# Patient Record
Sex: Male | Born: 1985 | Hispanic: Yes | Marital: Single | Smoking: Current every day smoker
Health system: Southern US, Community
[De-identification: ages and names within clinical notes are randomized; demographics above are authoritative.]

---

## 2006-03-06 ENCOUNTER — Emergency Department: Payer: Self-pay | Admitting: Emergency Medicine

## 2006-05-29 ENCOUNTER — Emergency Department: Payer: Self-pay | Admitting: Emergency Medicine

## 2007-03-21 ENCOUNTER — Emergency Department: Payer: Self-pay | Admitting: Emergency Medicine

## 2008-08-28 ENCOUNTER — Emergency Department: Payer: Self-pay | Admitting: Emergency Medicine

## 2014-06-11 ENCOUNTER — Emergency Department: Payer: Self-pay | Admitting: Student

## 2015-03-14 ENCOUNTER — Encounter: Payer: Self-pay | Admitting: Medical Oncology

## 2015-03-14 ENCOUNTER — Emergency Department
Admission: EM | Admit: 2015-03-14 | Discharge: 2015-03-14 | Disposition: A | Discharge status: Home / Self Care | Payer: Self-pay | Attending: Emergency Medicine | Admitting: Emergency Medicine

## 2015-03-14 DIAGNOSIS — F17200 Nicotine dependence, unspecified, uncomplicated: Secondary | ICD-10-CM | POA: Insufficient documentation

## 2015-03-14 DIAGNOSIS — K029 Dental caries, unspecified: Secondary | ICD-10-CM | POA: Insufficient documentation

## 2015-03-14 DIAGNOSIS — K0381 Cracked tooth: Secondary | ICD-10-CM | POA: Insufficient documentation

## 2015-03-14 DIAGNOSIS — K032 Erosion of teeth: Secondary | ICD-10-CM | POA: Insufficient documentation

## 2015-03-14 MED ORDER — DIFLUNISAL 500 MG PO TABS: 500.0000 mg | ORAL_TABLET | Freq: Two times a day (BID) | ORAL | Status: DC

## 2015-03-14 MED ORDER — MAGIC MOUTHWASH W/LIDOCAINE: 5.0000 mL | Freq: Four times a day (QID) | ORAL | Status: DC

## 2015-03-14 NOTE — Discharge Instructions (Signed)
Dental Care and Dentist Visits Dental care supports good overall health. Regular dental visits can also help you avoid dental pain, bleeding, infection, and other more serious health problems in the future. It is important to keep the mouth healthy because diseases in the teeth, gums, and other oral tissues can spread to other areas of the body. Some problems, such as diabetes, heart disease, and pre-term labor have been associated with poor oral health.  See your dentist every 6 months. If you experience emergency problems such as a toothache or broken tooth, go to the dentist right away. If you see your dentist regularly, you may catch problems early. It is easier to be treated for problems in the early stages.  WHAT TO EXPECT AT A DENTIST VISIT  Your dentist will look for many common oral health problems and recommend proper treatment. At your regular dental visit, you can expect:  Gentle cleaning of the teeth and gums. This includes scraping and polishing. This helps to remove the sticky substance around the teeth and gums (plaque). Plaque forms in the mouth shortly after eating. Over time, plaque hardens on the teeth as tartar. If tartar is not removed regularly, it can cause problems. Cleaning also helps remove stains.  Periodic X-rays. These pictures of the teeth and supporting bone will help your dentist assess the health of your teeth.  Periodic fluoride treatments. Fluoride is a natural mineral shown to help strengthen teeth. Fluoride treatmentinvolves applying a fluoride gel or varnish to the teeth. It is most commonly done in children.  Examination of the mouth, tongue, jaws, teeth, and gums to look for any oral health problems, such as:  Cavities (dental caries). This is decay on the tooth caused by plaque, sugar, and acid in the mouth. It is best to catch a cavity when it is small.  Inflammation of the gums caused by plaque buildup (gingivitis).  Problems with the mouth or malformed  or misaligned teeth.  Oral cancer or other diseases of the soft tissues or jaws. KEEP YOUR TEETH AND GUMS HEALTHY For healthy teeth and gums, follow these general guidelines as well as your dentist's specific advice:  Have your teeth professionally cleaned at the dentist every 6 months.  Brush twice daily with a fluoride toothpaste.  Floss your teeth daily.  Ask your dentist if you need fluoride supplements, treatments, or fluoride toothpaste.  Eat a healthy diet. Reduce foods and drinks with added sugar.  Avoid smoking. TREATMENT FOR ORAL HEALTH PROBLEMS If you have oral health problems, treatment varies depending on the conditions present in your teeth and gums.  Your caregiver will most likely recommend good oral hygiene at each visit.  For cavities, gingivitis, or other oral health disease, your caregiver will perform a procedure to treat the problem. This is typically done at a separate appointment. Sometimes your caregiver will refer you to another dental specialist for specific tooth problems or for surgery. SEEK IMMEDIATE DENTAL CARE IF:  You have pain, bleeding, or soreness in the gum, tooth, jaw, or mouth area.  A permanent tooth becomes loose or separated from the gum socket.  You experience a blow or injury to the mouth or jaw area.   This information is not intended to replace advice given to you by your health care provider. Make sure you discuss any questions you have with your health care provider.   Document Released: 12/15/2010 Document Revised: 06/27/2011 Document Reviewed: 12/15/2010 Elsevier Interactive Patient Education 2016 [employer]   Tooth Injuries  Tooth injuries (tooth trauma) include cracked or broken teeth (fractures), teeth that have been moved out of place or dislodged (luxations), and knocked-out teeth (avulsions).  A tooth injury often needs to be treated quickly to save the tooth. However, sometimes it is not possible to save a tooth  after an injury, so the tooth may need to be removed (extracted).  CAUSES  Tooth injuries may be caused by any force that is strong enough to chip, break, dislodge, or knock out a tooth. Forces may be due to:  Sports injuries.  Falls.  Accidents.  Fights. RISK FACTORS  You may be more likely to injure a tooth if you play a contact sport without using a mouthguard.  SYMPTOMS  A tooth that is forced into the gum may appear dislodged or moved out of position into the tooth socket. A fractured tooth may not be as obvious. Symptoms of a tooth injury include:  Pain, especially with chewing.  A loose tooth.  Bleeding in or around the tooth.  Swelling or bruising near the tooth.  Swelling or bruising of the lip over the injured tooth.  Increased sensitivity to heat and cold. DIAGNOSIS  A tooth injury can be diagnosed with a medical history and a physical exam. You may also need dental X-rays to check for injuries to the root of the tooth.  TREATMENT  Treatment depends on the type of injury you have and how bad it is. Treatment may need to be done quickly to save your tooth. Possible treatments include:  Replacing a tooth fragment with a filling, cap, or hard, protective cover (crown). This may be an option for a chip or fracture that does not involve the inside of your tooth (pulp).  Having a procedure to repair the inside of the tooth (root canal) and then having a crown placed on top. This may be done to treat a tooth fracture that involves the pulp.  Repositioning a dislodged tooth, then doing a root canal. The root canal usually needs to be done within a few days of the injury.  Replacing a knocked-out tooth in the socket, if possible, then doing a root canal a few weeks later.  Tooth extraction for a fracture that extends below your gumline or splits your tooth completely. HOME CARE INSTRUCTIONS  Take medicines only as directed by your dental provider or health care provider.  Keep all  follow-up visits as directed by your dental provider or health care provider. This is important.  Do not eat or chew on very hard objects. These include ice cubes, pens, pencils, hard candy, and popcorn kernels.  Do not clench or grind your teeth. Tell your dental provider or health care provider if you grind your teeth while you sleep.  Apply ice to your mouth near the injured tooth as directed by your dental provider or health care provider.  Follow instructions about rinsing your mouth with salt water as directed by your dental provider or health care provider.  Do not use your teeth to open packages.  Always wear mouth protection when you play contact sports. SEEK MEDICAL CARE IF:  You continue to have tooth pain after a tooth injury.  Your tooth is sensitive to heat and cold.  You develop swelling near your injured tooth.  You have a fever.  You are unable to open your jaw.  You are drooling and it is getting worse. This information is not intended to replace advice given to you by your health care  provider. Make sure you discuss any questions you have with your health care provider.  Document Released: 12/31/2003 Document Revised: 08/19/2014 Document Reviewed: 03/31/2014  Elsevier Interactive Patient Education [employer]

## 2015-03-14 NOTE — ED Notes (Signed)
Left sided tooth pain that began this am.

## 2015-03-14 NOTE — ED Notes (Signed)
States he developed pain to left upper gumline this am..states he thinks it may be a wisdom tooth

## 2015-03-14 NOTE — ED Provider Notes (Signed)
[facility] Emergency Department Provider Note  ____________________________________________  Time seen: Approximately 3:26 PM  I have reviewed the triage vital signs and the nursing notes.   HISTORY  Chief Complaint Dental Pain    HPI Bryan Bush is a 29 y.o. male who presents emergency department complaining of left upper mandible dental pain. He states that pain began this morning. He has a history of poor dentition and does not have a dentist. He states that there is no recent injury to dentition. He denies fevers or chills, difficulty swallowing or breathing, chest pain, vomiting. He states that he has been using cold mouthwashes to alleviate symptoms.   History reviewed. No pertinent past medical history.  There are no active problems to display for this patient.   History reviewed. No pertinent past surgical history.  Current Outpatient Rx  Name  Route  Sig  Dispense  Refill  . diflunisal (DOLOBID) 500 MG TABS tablet   Oral   Take 1 tablet (500 mg total) by mouth 2 (two) times daily.   60 tablet   0   . magic mouthwash w/lidocaine SOLN   Oral   Take 5 mLs by mouth 4 (four) times daily.   240 mL   0     Dispense in a 1/1/1/1 ratio     Allergies Review of patient's allergies indicates no known allergies.  No family history on file.  Social History Social History  Substance Use Topics  . Smoking status: Current Some Day Smoker  . Smokeless tobacco: None  . Alcohol Use: No    Review of Systems Constitutional: No fever/chills Eyes: No visual changes. ENT: No sore throat. Endorses dental pain to the left upper jaw. Cardiovascular: Denies chest pain. Respiratory: Denies shortness of breath. Gastrointestinal: No abdominal pain.  No nausea, no vomiting.  No diarrhea.  No constipation. Genitourinary: Negative for dysuria. Musculoskeletal: Negative for back pain. Skin: Negative for rash. Neurological: Negative for headaches,  focal weakness or numbness.  10-point ROS otherwise negative.  ____________________________________________   PHYSICAL EXAM:  VITAL SIGNS: ED Triage Vitals  Enc Vitals Group     BP 03/14/15 1440 121/84 mmHg     Pulse Rate 03/14/15 1440 83     Resp 03/14/15 1440 18     Temp 03/14/15 1440 98.4 F (36.9 C)     Temp Source 03/14/15 1440 Oral     SpO2 03/14/15 1440 99 %     Weight 03/14/15 1440 170 lb (77.111 kg)     Height 03/14/15 1440 5\' 4"  (1.626 m)     Head Cir --      Peak Flow --      Pain Score 03/14/15 1440 10     Pain Loc --      Pain Edu? --      Excl. in GC? --     Constitutional: Alert and oriented. Well appearing and in no acute distress. Eyes: Conjunctivae are normal. PERRL. EOMI. Head: Atraumatic. Nose: No congestion/rhinnorhea. Mouth/Throat: Mucous membranes are moist.  Oropharynx non-erythematous. Poor dentition throughout. Multiple caries, rushes, some fractures noted. Patient's wisdom tooth on the left upper severely eroded with pulp/nerve in the exposed. No surrounding erythema or edema. Loose fragments. Neck: No stridor.   Cardiovascular: Normal rate, regular rhythm. Grossly normal heart sounds.  Good peripheral circulation. Respiratory: Normal respiratory effort.  No retractions. Lungs CTAB. Gastrointestinal: Soft and nontender. No distention. No abdominal bruits. No CVA tenderness. Musculoskeletal: No lower extremity tenderness nor edema.  No  joint effusions. Neurologic:  Normal speech and language. No gross focal neurologic deficits are appreciated. No gait instability. Skin:  Skin is warm, dry and intact. No rash noted. Psychiatric: Mood and affect are normal. Speech and behavior are normal.  ____________________________________________   LABS (all labs ordered are listed, but only abnormal results are displayed)  Labs Reviewed - No data to  display ____________________________________________  EKG   ____________________________________________  RADIOLOGY   ____________________________________________   PROCEDURES  Procedure(s) performed: None  Critical Care performed: No  ____________________________________________   INITIAL IMPRESSION / ASSESSMENT AND PLAN / ED COURSE  Pertinent labs & imaging results that were available during my care of the patient were reviewed by me and considered in my medical decision making (see chart for details).  She is history, symptoms, physical exam are taken into consideration for diagnosis. Patient is to be given anti-inflammatories and magic mouthwash for symptom control. I advised the patient that he is to follow-up with [facility] dental clinic for extensive dental care. Patient verbalizes understanding of diagnosis and treatment plan and verbalizes compliance with same. Tender block was offered in the emergency department and patient declined at this time. ____________________________________________   FINAL CLINICAL IMPRESSION(S) / ED DIAGNOSES  Final diagnoses:  Tooth erosion      Racheal Patches, PA-C 03/14/15 1601  Arnaldo Natal, MD 03/15/15 2055

## 2015-04-08 ENCOUNTER — Encounter: Payer: Self-pay | Admitting: *Deleted

## 2015-04-08 ENCOUNTER — Emergency Department
Admission: EM | Admit: 2015-04-08 | Discharge: 2015-04-08 | Disposition: A | Discharge status: Home / Self Care | Payer: Self-pay | Attending: Emergency Medicine | Admitting: Emergency Medicine

## 2015-04-08 DIAGNOSIS — K0381 Cracked tooth: Secondary | ICD-10-CM | POA: Insufficient documentation

## 2015-04-08 DIAGNOSIS — F17200 Nicotine dependence, unspecified, uncomplicated: Secondary | ICD-10-CM | POA: Insufficient documentation

## 2015-04-08 DIAGNOSIS — Z791 Long term (current) use of non-steroidal anti-inflammatories (NSAID): Secondary | ICD-10-CM | POA: Insufficient documentation

## 2015-04-08 DIAGNOSIS — K029 Dental caries, unspecified: Secondary | ICD-10-CM | POA: Insufficient documentation

## 2015-04-08 DIAGNOSIS — Z79899 Other long term (current) drug therapy: Secondary | ICD-10-CM | POA: Insufficient documentation

## 2015-04-08 MED ORDER — AMOXICILLIN 500 MG PO TABS: 500.0000 mg | ORAL_TABLET | Freq: Two times a day (BID) | ORAL | Status: DC

## 2015-04-08 MED ORDER — LIDOCAINE VISCOUS 2 % MT SOLN: 20.0000 mL | OROMUCOSAL | Status: DC | PRN

## 2015-04-08 MED ORDER — HYDROCODONE-ACETAMINOPHEN 5-325 MG PO TABS: 1.0000 | ORAL_TABLET | ORAL | Status: DC | PRN

## 2015-04-08 MED ORDER — IBUPROFEN 800 MG PO TABS: 800.0000 mg | ORAL_TABLET | Freq: Three times a day (TID) | ORAL | Status: DC | PRN

## 2015-04-08 NOTE — ED Provider Notes (Signed)
[facility] Emergency Department Provider Note  ____________________________________________  Time seen: Approximately 8:22 PM  I have reviewed the triage vital signs and the nursing notes.   HISTORY  Chief Complaint Dental Pain   HPI Bryan Bush is a 29 y.o. male resents with left lower jaw pain. Patient states he got a history of dental problems but is never seen a dentist.   History reviewed. No pertinent past medical history.  There are no active problems to display for this patient.   History reviewed. No pertinent past surgical history.  Current Outpatient Rx  Name  Route  Sig  Dispense  Refill  . amoxicillin (AMOXIL) 500 MG tablet   Oral   Take 1 tablet (500 mg total) by mouth 2 (two) times daily.   20 tablet   0   . diflunisal (DOLOBID) 500 MG TABS tablet   Oral   Take 1 tablet (500 mg total) by mouth 2 (two) times daily.   60 tablet   0   . HYDROcodone-acetaminophen (NORCO) 5-325 MG tablet   Oral   Take 1-2 tablets by mouth every 4 (four) hours as needed for moderate pain.   15 tablet   0   . ibuprofen (ADVIL,MOTRIN) 800 MG tablet   Oral   Take 1 tablet (800 mg total) by mouth every 8 (eight) hours as needed.   30 tablet   0   . lidocaine (XYLOCAINE) 2 % solution   Mouth/Throat   Use as directed 20 mLs in the mouth or throat as needed for mouth pain.   100 mL   0   . magic mouthwash w/lidocaine SOLN   Oral   Take 5 mLs by mouth 4 (four) times daily.   240 mL   0     Dispense in a 1/1/1/1 ratio     Allergies Review of patient's allergies indicates no known allergies.  No family history on file.  Social History Social History  Substance Use Topics  . Smoking status: Current Some Day Smoker  . Smokeless tobacco: None  . Alcohol Use: No    Review of Systems Constitutional: No fever/chills Eyes: No visual changes. ENT: No sore throat. Positive for dental caries. Positive for dental pain Cardiovascular:  Denies chest pain. Respiratory: Denies shortness of breath. Gastrointestinal: No abdominal pain.  No nausea, no vomiting.  No diarrhea.  No constipation. Genitourinary: Negative for dysuria. Musculoskeletal: Negative for back pain. Skin: Negative for rash. Neurological: Negative for headaches, focal weakness or numbness.  10-point ROS otherwise negative.  ____________________________________________   PHYSICAL EXAM:  VITAL SIGNS: ED Triage Vitals  Enc Vitals Group     BP 04/08/15 1926 119/90 mmHg     Pulse Rate 04/08/15 1926 81     Resp 04/08/15 1926 18     Temp 04/08/15 1926 98.6 F (37 C)     Temp Source 04/08/15 1926 Oral     SpO2 04/08/15 1926 98 %     Weight 04/08/15 1926 170 lb (77.111 kg)     Height 04/08/15 1926 5\' 4"  (1.626 m)     Head Cir --      Peak Flow --      Pain Score 04/08/15 1927 5     Pain Loc --      Pain Edu? --      Excl. in GC? --     Constitutional: Alert and oriented. Well appearing and in no acute distress. Eyes: Conjunctivae are normal. PERRL. EOMI. Head:  Atraumatic. Nose: No congestion/rhinnorhea. Mouth/Throat: Mucous membranes are moist.  Oropharynx non-erythematous. Multiple dental caries noted throughout the mouth. Almost every tooth is either fractured or has cavities. Neck: No stridor.   Cardiovascular: Normal rate, regular rhythm. Grossly normal heart sounds.  Good peripheral circulation. Respiratory: Normal respiratory effort.  No retractions. Lungs CTAB.Marland Kitchen Musculoskeletal: No lower extremity tenderness nor edema.  No joint effusions. Neurologic:  Normal speech and language. No gross focal neurologic deficits are appreciated. No gait instability. Skin:  Skin is warm, dry and intact. No rash noted. Psychiatric: Mood and affect are normal. Speech and behavior are normal.  ____________________________________________   LABS (all labs ordered are listed, but only abnormal results are displayed)  Labs Reviewed - No data to  display ____________________________________________   PROCEDURES  Procedure(s) performed: None  Critical Care performed: No  ____________________________________________   INITIAL IMPRESSION / ASSESSMENT AND PLAN / ED COURSE  Pertinent labs & imaging results that were available during my care of the patient were reviewed by me and considered in my medical decision making (see chart for details).  Multiple dental caries with early gum inflammation and abscess. Rx given for amoxicillin 500 mg 3 times a day. Motrin 800 mg 3 times a day and Biaxin 5/325 and viscous lidocaine. Patient follow-up with the attached list of dental. ____________________________________________   FINAL CLINICAL IMPRESSION(S) / ED DIAGNOSES  Final diagnoses:  Dental caries      Evangeline Dakin, PA-C 04/08/15 2030  Myrna Blazer, MD 04/09/15 [id number]

## 2015-04-08 NOTE — Discharge Instructions (Signed)
OPTIONS FOR DENTAL FOLLOW UP CARE ° °[place] Department of Health and Human Services - Local Safety Net Dental Clinics °http://www.ncdhhs.gov/dph/oralhealth/services/safetynetclinics.htm °  °[facility] (336-562-3123) ° °[facility] ([phone]) ° °[facility] ([phone] ext [phone]) ° °[facility] ([phone]) ° °[facility] ([phone]) °This clinic caters to the indigent population and is on a lottery system. °Location: °[facility], [address], [place] °Clinic Hours: °Wednesdays from 6pm - 9pm, patients seen by a lottery system. °For dates, call or go to [email or web address]/[email or web address] °Services: °Cleanings, fillings and simple extractions. °Payment Options: °DENTAL WORK IS FREE OF CHARGE. Bring proof of income or support. °Best way to get seen: °Arrive at 5:15 pm - this is a lottery, NOT first come/first serve, so arriving earlier will not increase your chances of being seen. °  °  °[facility] °[phone] °Select option 1 for emergencies °  °Location: °[facility], [address], [place] °Clinic Hours: °No walk-ins accepted - call the day before to schedule an appointment. °Check in times are 9:30 am and 1:30 pm. °Services: °Simple extractions, temporary fillings, pulpectomy/pulp debridement, uncomplicated abscess drainage. °Payment Options: °PAYMENT IS DUE AT THE TIME OF SERVICE.  Fee is usually $100-200, additional surgical procedures (e.g. abscess drainage) may be extra. °Cash, checks, Visa/MasterCard accepted.  Can file Medicaid if patient is covered for dental - patient should call case worker to check. °No discount for [facility] patients. °Best way to get seen: °MUST call the day before and get onto the schedule. Can usually be seen the next 1-2 days. No walk-ins accepted. °  °  °[facility] °[phone] °   °Location: °[facility], [address], [place] °Clinic Hours: °M, W, Th, F 8am or 1:30pm, Tues 9a or 1:30 - first come/first served. °Services: °Simple extractions, temporary fillings, uncomplicated abscess drainage.  You do not need to be an [place] resident. °Payment Options: °PAYMENT IS DUE AT THE TIME OF SERVICE. °Dental insurance, otherwise sliding scale - bring proof of income or support. °Depending on income and treatment needed, cost is usually $50-200. °Best way to get seen: °Arrive early as it is first come/first served. °  °  °[facility] °[phone] °  °Location: °[address] °Clinic Hours: °Mon-Thu 8a-5p °Services: °Most basic dental services including extractions and fillings. °Payment Options: °PAYMENT IS DUE AT THE TIME OF SERVICE. °Sliding scale, up to 50% off - bring proof if income or support. °Medicaid with dental option accepted. °Best way to get seen: °Call to schedule an appointment, can usually be seen within 2 weeks OR they will try to see walk-ins - show up at 8a or 2p (you may have to wait). °  °  °[facility] °[phone] °[place] RESIDENTS ONLY °  °Location: °[facility], [address], [place],  [address] °Clinic Hours: By appointment only. °Monday - Thursday 8am-5pm, Friday 8am-12pm °Services: Cleanings, fillings, extractions. °Payment Options: °PAYMENT IS DUE AT THE TIME OF SERVICE. °Cash, Visa or MasterCard. Sliding scale - $30 minimum per service. °Best way to get seen: °Come in to office, complete packet and make an appointment - need proof of income °or support monies for each household member and proof of [place] residence. °Usually takes about a month to get in. °  °  °[facility] °[phone] °  °Location: °[address]   [place] [facility] Hours: Walk-in Urgent Care Dental Services are offered Monday-Friday  mornings only. The numbers of emergencies accepted daily is limited to the number of providers available. Maximum 15 - Mondays, Wednesdays & Thursdays Maximum 10 - Tuesdays & Fridays Services: You do not need to be a [place] resident to be seen for a dental emergency. Emergencies are defined as pain, swelling, abnormal bleeding, or dental trauma. Walkins will receive x-rays if needed. NOTE: Dental cleaning is not an emergency. Payment Options: PAYMENT IS DUE AT THE TIME OF SERVICE. Minimum co-pay is $40.00 for uninsured patients. Minimum co-pay is $3.00 for Medicaid with dental coverage. Dental Insurance is accepted and must be presented at time of visit. Medicare does not cover dental. Forms of payment: Cash, credit card, checks. Best way to get seen: If not previously registered with the clinic, walk-in dental registration begins at 7:15 am and is on a first come/first serve basis. If previously registered with the clinic, call to make an appointment.     [facility] [id number] [place] RESIDENTS ONLY   Location: [address], [place], [place] Clinic Hours: Mon-Thu 10a-2p Services: Extractions only! Payment Options: FREE (donations accepted) - bring proof of income or support Best way to get seen: Call and schedule an appointment OR come at 8am on the 1st Monday of every month (except for holidays) when it is first come/first served.     [facility] [phone]   Location: [address], [place] [facility] Hours: Friday mornings Services, Payment Options, Best way to get seen: Call for info   Dental Caries Dental caries (also called tooth decay) is the most common oral disease. It can occur at any age but is more common in children and young adults.  HOW DENTAL CARIES DEVELOPS  The process of decay begins when bacteria and foods (particularly sugars and starches) combine in your mouth to produce plaque. Plaque is a substance that sticks to  the hard, outer surface of a tooth (enamel). The bacteria in plaque produce acids that attack enamel. These acids may also attack the root surface of a tooth (cementum) if it is exposed. Repeated attacks dissolve these surfaces and create holes in the tooth (cavities). If left untreated, the acids destroy the other layers of the tooth.  RISK FACTORS  Frequent sipping of sugary beverages.   Frequent snacking on sugary and starchy foods, especially those that easily get stuck in the teeth.   Poor oral hygiene.   Dry mouth.   Substance abuse such as methamphetamine abuse.   Broken or poor-fitting dental restorations.   Eating disorders.   Gastroesophageal reflux disease (GERD).   Certain radiation treatments to the head and neck. SYMPTOMS In the early stages of dental caries, symptoms are seldom present. Sometimes white, chalky areas may be seen on the enamel or other tooth layers. In later stages, symptoms may include:  Pits and holes on the enamel.  Toothache after sweet, hot, or cold foods or drinks are consumed.  Pain around the tooth.  Swelling around the tooth. DIAGNOSIS  Most of the time, dental caries is detected during a regular dental checkup. A diagnosis is made after a thorough medical and dental history is taken and the surfaces of your teeth are checked for signs of dental caries. Sometimes special instruments, such as lasers, are used to check for dental caries. Dental X-ray exams may be taken so that areas not visible to the eye (such as between the contact areas  of the teeth) can be checked for cavities.  °TREATMENT  °If dental caries is in its early stages, it may be reversed with a fluoride treatment or an application of a remineralizing agent at the dental office. Thorough brushing and flossing at home is needed to aid these treatments. If it is in its later stages, treatment depends on the location and extent of tooth destruction:  °· If a small area of the  tooth has been destroyed, the destroyed area will be removed and cavities will be filled with a material such as gold, silver amalgam, or composite resin.   °· If a large area of the tooth has been destroyed, the destroyed area will be removed and a cap (crown) will be fitted over the remaining tooth structure.   °· If the center part of the tooth (pulp) is affected, a procedure called a root canal will be needed before a filling or crown can be placed.   °· If most of the tooth has been destroyed, the tooth may need to be pulled (extracted). °HOME CARE INSTRUCTIONS °You can prevent, stop, or reverse dental caries at home by practicing good oral hygiene. Good oral hygiene includes: °· Thoroughly cleaning your teeth at least twice a day with a toothbrush and dental floss.   °· Using a fluoride toothpaste. A fluoride mouth rinse may also be used if recommended by your dentist or health care provider.   °· Restricting the amount of sugary and starchy foods and sugary liquids you consume.   °· Avoiding frequent snacking on these foods and sipping of these liquids.   °· Keeping regular visits with a dentist for checkups and cleanings. °PREVENTION  °· Practice good oral hygiene. °· Consider a dental sealant. A dental sealant is a coating material that is applied by your dentist to the pits and grooves of teeth. The sealant prevents food from being trapped in them. It may protect the teeth for several years. °· Ask about fluoride supplements if you live in a community without fluorinated water or with water that has a low fluoride content. Use fluoride supplements as directed by your dentist or health care provider. °· Allow fluoride varnish applications to teeth if directed by your dentist or health care provider. °  °This information is not intended to replace advice given to you by your health care provider. Make sure you discuss any questions you have with your health care provider. °  °Document Released: 12/25/2001  Document Revised: 04/25/2014 Document Reviewed: 04/06/2012 °Elsevier Interactive Patient Education ©2016 [employer] ° °

## 2015-04-08 NOTE — ED Notes (Signed)
Patient c/o dental pain in left lower jaw.

## 2015-04-29 ENCOUNTER — Emergency Department
Admission: EM | Admit: 2015-04-29 | Discharge: 2015-04-29 | Disposition: A | Discharge status: Home / Self Care | Payer: Self-pay | Attending: Student | Admitting: Student

## 2015-04-29 ENCOUNTER — Encounter: Payer: Self-pay | Admitting: Emergency Medicine

## 2015-04-29 DIAGNOSIS — F17200 Nicotine dependence, unspecified, uncomplicated: Secondary | ICD-10-CM | POA: Insufficient documentation

## 2015-04-29 DIAGNOSIS — Z79899 Other long term (current) drug therapy: Secondary | ICD-10-CM | POA: Insufficient documentation

## 2015-04-29 DIAGNOSIS — K0889 Other specified disorders of teeth and supporting structures: Secondary | ICD-10-CM | POA: Insufficient documentation

## 2015-04-29 MED ORDER — AMOXICILLIN 500 MG PO TABS: 500.0000 mg | ORAL_TABLET | Freq: Two times a day (BID) | ORAL | Status: DC

## 2015-04-29 MED ORDER — HYDROCODONE-ACETAMINOPHEN 5-325 MG PO TABS: 1.0000 | ORAL_TABLET | ORAL | Status: DC | PRN

## 2015-04-29 MED ORDER — LIDOCAINE VISCOUS 2 % MT SOLN: 20.0000 mL | OROMUCOSAL | Status: DC | PRN

## 2015-04-29 MED ORDER — IBUPROFEN 800 MG PO TABS: 800.0000 mg | ORAL_TABLET | Freq: Three times a day (TID) | ORAL | Status: DC | PRN

## 2015-04-29 NOTE — ED Notes (Signed)
Pt reports being here for right sided upper dental pain.

## 2015-04-29 NOTE — Discharge Instructions (Signed)
OPTIONS FOR DENTAL FOLLOW UP CARE ° °[place] Department of Health and Human Services - Local Safety Net Dental Clinics °http://www.ncdhhs.gov/dph/oralhealth/services/safetynetclinics.htm °  °[facility] ([phone]) ° °[facility] ([phone]) ° °[facility] ([phone] ext [phone]) ° °[facility] ([phone]) ° °[facility] ([phone]) °This clinic caters to the indigent population and is on a lottery system. °Location: °[facility], [address], [place] °Clinic Hours: °Wednesdays from 6pm - 9pm, patients seen by a lottery system. °For dates, call or go to [email or web address] °Services: °Cleanings, fillings and simple extractions. °Payment Options: °DENTAL WORK IS FREE OF CHARGE. Bring proof of income or support. °Best way to get seen: °Arrive at 5:15 pm - this is a lottery, NOT first come/first serve, so arriving earlier will not increase your chances of being seen. °  °  °[facility] °[phone] °Select option 1 for emergencies °  °Location: °[facility], [address], [place] °Clinic Hours: °No walk-ins accepted - call the day before to schedule an appointment. °Check in times are 9:30 am and 1:30 pm. °Services: °Simple extractions, temporary fillings, pulpectomy/pulp debridement, uncomplicated abscess drainage. °Payment Options: °PAYMENT IS DUE AT THE TIME OF SERVICE.  Fee is usually $100-200, additional surgical procedures (e.g. abscess drainage) may be extra. °Cash, checks, Visa/MasterCard accepted.  Can file Medicaid if patient is covered for dental - patient should call case worker to check. °No discount for [facility] patients. °Best way to get seen: °MUST call the day before and get onto the schedule. Can usually be seen the next 1-2 days. No walk-ins accepted. °  °  °[facility] °[phone] °   °Location: °[facility], [address], [place] °Clinic Hours: °M, W, Th, F 8am or 1:30pm, Tues 9a or 1:30 - first come/first served. °Services: °Simple extractions, temporary fillings, uncomplicated abscess drainage.  You do not need to be an [place] resident. °Payment Options: °PAYMENT IS DUE AT THE TIME OF SERVICE. °Dental insurance, otherwise sliding scale - bring proof of income or support. °Depending on income and treatment needed, cost is usually $50-200. °Best way to get seen: °Arrive early as it is first come/first served. °  °  °[facility] °[phone] °  °Location: °[address] °Clinic Hours: °Mon-Thu 8a-5p °Services: °Most basic dental services including extractions and fillings. °Payment Options: °PAYMENT IS DUE AT THE TIME OF SERVICE. °Sliding scale, up to 50% off - bring proof if income or support. °Medicaid with dental option accepted. °Best way to get seen: °Call to schedule an appointment, can usually be seen within 2 weeks OR they will try to see walk-ins - show up at 8a or 2p (you may have to wait). °  °  °[facility] °[phone] °[place] RESIDENTS ONLY °  °Location: °[facility], [address], [place], [place] [address] °Clinic Hours: By appointment only. °Monday - Thursday 8am-5pm, Friday 8am-12pm °Services: Cleanings, fillings, extractions. °Payment Options: °PAYMENT IS DUE AT THE TIME OF SERVICE. °Cash, Visa or MasterCard. Sliding scale - $30 minimum per service. °Best way to get seen: °Come in to office, complete packet and make an appointment - need proof of income °or support monies for each household member and proof of [place] residence. °Usually takes about a month to get in. °  °  °[facility] °[phone] °  °Location: °[address]   [place] °Clinic Hours: Walk-in Urgent Care Dental Services are offered Monday-Friday  mornings only. °The numbers of emergencies accepted daily is limited to the number of °providers available. °Maximum 15 - Mondays, Wednesdays & Thursdays °Maximum 10 - Tuesdays & Fridays °Services: °You do not need to be a [place] resident to be seen for a dental emergency. °Emergencies are defined as pain, swelling, abnormal bleeding, or dental trauma. Walkins will receive x-rays if needed. °NOTE: Dental cleaning is not an emergency. °Payment Options: °PAYMENT IS DUE AT THE TIME OF SERVICE. °Minimum co-pay is $40.00 for uninsured patients. °Minimum co-pay is $3.00 for Medicaid with dental coverage. °Dental Insurance is accepted and must be presented at time of visit. °Medicare does not cover dental. °Forms of payment: Cash, credit card, checks. °Best way to get seen: °If not previously registered with the clinic, walk-in dental registration begins at 7:15 am and is on a first come/first serve basis. °If previously registered with the clinic, call to make an appointment. °  °  °[facility] °[phone] °[place] RESIDENTS ONLY °  °Location: °[address], [place], [place] °Clinic Hours: °Mon-Thu 10a-2p °Services: Extractions only! °Payment Options: °FREE (donations accepted) - bring proof of income or support °Best way to get seen: °Call and schedule an appointment OR come at 8am on the 1st Monday of every month (except for holidays) when it is first come/first served. °  °  °[facility] °[phone] °  °Location: °[address], [place] °Clinic Hours: °Friday mornings °Services, Payment Options, Best way to get seen: °Call for info °

## 2015-04-29 NOTE — ED Provider Notes (Signed)
[facility] Emergency Department Provider Note  ____________________________________________  Time seen: Approximately 6:35 PM  I have reviewed the triage vital signs and the nursing notes.   HISTORY  Chief Complaint Dental Pain    HPI Jaceon Heiberger is a 30 y.o. male presents with complaints of right sided upper dental pain. Patient seen here a month ago for left lower side dental pain. Patient reports not being able to see the dentist at this time.   No past medical history on file.  There are no active problems to display for this patient.   No past surgical history on file.  Current Outpatient Rx  Name  Route  Sig  Dispense  Refill  . amoxicillin (AMOXIL) 500 MG tablet   Oral   Take 1 tablet (500 mg total) by mouth 2 (two) times daily.   20 tablet   0   . diflunisal (DOLOBID) 500 MG TABS tablet   Oral   Take 1 tablet (500 mg total) by mouth 2 (two) times daily.   60 tablet   0   . HYDROcodone-acetaminophen (NORCO) 5-325 MG tablet   Oral   Take 1-2 tablets by mouth every 4 (four) hours as needed for moderate pain.   15 tablet   0   . ibuprofen (ADVIL,MOTRIN) 800 MG tablet   Oral   Take 1 tablet (800 mg total) by mouth every 8 (eight) hours as needed.   30 tablet   0   . lidocaine (XYLOCAINE) 2 % solution   Mouth/Throat   Use as directed 20 mLs in the mouth or throat as needed for mouth pain.   100 mL   0     Allergies Review of patient's allergies indicates no known allergies.  History reviewed. No pertinent family history.  Social History Social History  Substance Use Topics  . Smoking status: Current Some Day Smoker  . Smokeless tobacco: None  . Alcohol Use: No    Review of Systems Constitutional: No fever/chills Eyes: No visual changes. ENT: No sore throat. Cardiovascular: Denies chest pain. Respiratory: Denies shortness of breath. Gastrointestinal: No abdominal pain.  No nausea, no vomiting.  No diarrhea.  No  constipation. Genitourinary: Negative for dysuria. Musculoskeletal: Negative for back pain. Skin: Negative for rash. Neurological: Negative for headaches, focal weakness or numbness.  10-point ROS otherwise negative.  ____________________________________________   PHYSICAL EXAM:  VITAL SIGNS: ED Triage Vitals  Enc Vitals Group     BP 04/29/15 1823 129/74 mmHg     Pulse Rate 04/29/15 1823 81     Resp --      Temp 04/29/15 1823 98 F (36.7 C)     Temp Source 04/29/15 1823 Oral     SpO2 04/29/15 1823 98 %     Weight 04/29/15 1823 170 lb (77.111 kg)     Height 04/29/15 1823 5\' 4"  (1.626 m)     Head Cir --      Peak Flow --      Pain Score 04/29/15 1823 5     Pain Loc --      Pain Edu? --      Excl. in GC? --     Constitutional: Alert and oriented. Well appearing and in no acute distress. Eyes: Conjunctivae are normal. PERRL. EOMI. Head: Atraumatic. Nose: No congestion/rhinnorhea. Mouth/Throat: Mucous membranes are moist.  Oropharynx non-erythematous. Neck: No stridor.   Cardiovascular: Normal rate, regular rhythm. Grossly normal heart sounds.  Good peripheral circulation. Respiratory: Normal respiratory effort.  No retractions. Lungs CTAB. Gastrointestinal: Soft and nontender. No distention. No abdominal bruits. No CVA tenderness. Musculoskeletal: No lower extremity tenderness nor edema.  No joint effusions. Neurologic:  Normal speech and language. No gross focal neurologic deficits are appreciated. No gait instability. Skin:  Skin is warm, dry and intact. No rash noted. Psychiatric: Mood and affect are normal. Speech and behavior are normal.  ____________________________________________   LABS (all labs ordered are listed, but only abnormal results are displayed)  Labs Reviewed - No data to display   PROCEDURES  Procedure(s) performed: None  Critical Care performed: No  ____________________________________________   INITIAL IMPRESSION / ASSESSMENT AND PLAN  / ED COURSE  Pertinent labs & imaging results that were available during my care of the patient were reviewed by me and considered in my medical decision making (see chart for details).  Dental caries. Rx given for Amoxil 500 mg 3 times a day, viscous lidocaine, Motrin 800 mg 3 times a day, and Vicodin 5/325. Patient follow-up PCP or see the dentist with attached list provided. Patient voices no other emergency medical complaints at this time. ____________________________________________   FINAL CLINICAL IMPRESSION(S) / ED DIAGNOSES  Final diagnoses:  Pain, dental      Evangeline Dakinharles M Laurynn Mccorvey, PA-C 04/29/15 1848  Gayla DossEryka A Gayle, MD 04/29/15 2308

## 2015-11-06 ENCOUNTER — Emergency Department
Admission: EM | Admit: 2015-11-06 | Discharge: 2015-11-06 | Disposition: A | Discharge status: Home / Self Care | Payer: Self-pay | Attending: Emergency Medicine | Admitting: Emergency Medicine

## 2015-11-06 ENCOUNTER — Encounter: Payer: Self-pay | Admitting: *Deleted

## 2015-11-06 DIAGNOSIS — K032 Erosion of teeth: Secondary | ICD-10-CM

## 2015-11-06 DIAGNOSIS — K047 Periapical abscess without sinus: Secondary | ICD-10-CM | POA: Insufficient documentation

## 2015-11-06 DIAGNOSIS — F17200 Nicotine dependence, unspecified, uncomplicated: Secondary | ICD-10-CM | POA: Insufficient documentation

## 2015-11-06 DIAGNOSIS — K029 Dental caries, unspecified: Secondary | ICD-10-CM

## 2015-11-06 MED ORDER — AMOXICILLIN 875 MG PO TABS: 875.0000 mg | ORAL_TABLET | Freq: Two times a day (BID) | ORAL | Status: DC

## 2015-11-06 MED ORDER — LIDOCAINE-EPINEPHRINE 2 %-1:100000 IJ SOLN
1.7000 mL | Freq: Once | INTRAMUSCULAR | Status: AC
  Administered 2015-11-06: 1.7 mL
  Filled 2015-11-06: qty 1.7

## 2015-11-06 MED ORDER — IBUPROFEN 800 MG PO TABS: 800.0000 mg | ORAL_TABLET | Freq: Three times a day (TID) | ORAL | Status: DC | PRN

## 2015-11-06 MED ORDER — MAGIC MOUTHWASH W/LIDOCAINE: 5.0000 mL | Freq: Four times a day (QID) | ORAL | Status: DC

## 2015-11-06 NOTE — ED Notes (Signed)
  Reviewed d/c instructions, follow-up care, and prescriptions with pt. Pt verbalized understanding 

## 2015-11-06 NOTE — ED Provider Notes (Signed)
[facility] Emergency Department Provider Note  ____________________________________________  Time seen: Approximately 8:01 PM  I have reviewed the triage vital signs and the nursing notes.   HISTORY  Chief Complaint Dental Pain    HPI Grayce Sessionslexandro Donnald GarreLara is a 30 y.o. male who presents to emergency department complaining of upper and in lower left dental pain. Patient states that he has known bad dentition but does not have a dentist. Patient denies any fevers or chills, difficulty swallowing or breathing. Patient reports that left upper dental pain has been intermittent for weeks 2 days. Patient reports sharp left lower dental pain. Patient has not tried any medications for this complaint prior to arrival. It is described as sharp, severe with chewing.   History reviewed. No pertinent past medical history.  There are no active problems to display for this patient.   History reviewed. No pertinent past surgical history.  Current Outpatient Rx  Name  Route  Sig  Dispense  Refill  . amoxicillin (AMOXIL) 875 MG tablet   Oral   Take 1 tablet (875 mg total) by mouth 2 (two) times daily.   14 tablet   0   . diflunisal (DOLOBID) 500 MG TABS tablet   Oral   Take 1 tablet (500 mg total) by mouth 2 (two) times daily.   60 tablet   0   . HYDROcodone-acetaminophen (NORCO) 5-325 MG tablet   Oral   Take 1-2 tablets by mouth every 4 (four) hours as needed for moderate pain.   15 tablet   0   . ibuprofen (ADVIL,MOTRIN) 800 MG tablet   Oral   Take 1 tablet (800 mg total) by mouth every 8 (eight) hours as needed.   30 tablet   0   . lidocaine (XYLOCAINE) 2 % solution   Mouth/Throat   Use as directed 20 mLs in the mouth or throat as needed for mouth pain.   100 mL   0   . magic mouthwash w/lidocaine SOLN   Oral   Take 5 mLs by mouth 4 (four) times daily.   240 mL   0     Dispense in a 1/1/1/1 ratio. Use lidocaine, diphen ...     Allergies Review of  patient's allergies indicates no known allergies.  History reviewed. No pertinent family history.  Social History Social History  Substance Use Topics  . Smoking status: Current Some Day Smoker  . Smokeless tobacco: Never Used  . Alcohol Use: No     Review of Systems  Constitutional: No fever/chills Eyes: No visual changes. No discharge ZOX:WRUEAVWUENT:Positive for left upper and lower dental pain. Cardiovascular: no chest pain. Respiratory: no cough. No SOB. Gastrointestinal: No abdominal pain.  No nausea, no vomiting.  Musculoskeletal: Negative for musculoskeletal pain. Skin: Negative for rash, abrasions, lacerations, ecchymosis. Neurological: Negative for headaches, focal weakness or numbness. 10-point ROS otherwise negative.  ____________________________________________   PHYSICAL EXAM:  VITAL SIGNS: ED Triage Vitals  Enc Vitals Group     BP 11/06/15 1936 136/84 mmHg     Pulse Rate 11/06/15 1936 92     Resp 11/06/15 1936 18     Temp 11/06/15 1936 98.2 F (36.8 C)     Temp Source 11/06/15 1936 Oral     SpO2 11/06/15 1936 98 %     Weight 11/06/15 1936 170 lb (77.111 kg)     Height 11/06/15 1936 5\' 5"  (1.651 m)     Head Cir --      Peak  Flow --      Pain Score 11/06/15 1937 5     Pain Loc --      Pain Edu? --      Excl. in GC? --      Constitutional: Alert and oriented. Well appearing and in no acute distress. Eyes: Conjunctivae are normal. PERRL. EOMI. Head: Atraumatic. ENT:      Ears:       Nose: No congestion/rhinnorhea.      Mouth/Throat: Mucous membranes are moist. Poor dentition throughout mouth. Multiple significant erosions into the pulp. Patient has surrounding erythema and edema to teeth #18 and 19. Significant erosion into dentition is observed. Oropharynx is nonerythematous and nonedematous. Neck: No stridor.   Hematological/Lymphatic/Immunilogical: No cervical lymphadenopathy. Cardiovascular: Normal rate, regular rhythm. Normal S1 and S2.  Good peripheral  circulation. Respiratory: Normal respiratory effort without tachypnea or retractions. Lungs CTAB. Good air entry to the bases with no decreased or absent breath sounds. Musculoskeletal: Full range of motion to all extremities. No gross deformities appreciated. Neurologic:  Normal speech and language. No gross focal neurologic deficits are appreciated.  Skin:  Skin is warm, dry and intact. No rash noted. Psychiatric: Mood and affect are normal. Speech and behavior are normal. Patient exhibits appropriate insight and judgement.   ____________________________________________   LABS (all labs ordered are listed, but only abnormal results are displayed)  Labs Reviewed - No data to display ____________________________________________  EKG   ____________________________________________  RADIOLOGY   No results found.  ____________________________________________    PROCEDURES  Procedure(s) performed:    Dental block is performed using 1.7 mL of lidocaine with epi. This is injected into the mandibular branch of the trigeminal nerve. Injection is performed using a 27-gauge needle on the dental carpijet. Patient tolerated procedure well with no complications.   Medications  lidocaine-EPINEPHrine (XYLOCAINE W/EPI) 2 %-1:100000 (with pres) injection 1.7 mL (not administered)     ____________________________________________   INITIAL IMPRESSION / ASSESSMENT AND PLAN / ED COURSE  Pertinent labs & imaging results that were available during my care of the patient were reviewed by me and considered in my medical decision making (see chart for details).  Patient's diagnosis is consistent with dental erosions, dental caries, and infected dentition. Patient is given a dental block in the emergency department for symptom control which she tolerated well.. Patient will be discharged home with prescriptions for antibiotics, and 100 mg ibuprofen, Magic mouthwash. Patient is to follow up  with [facility] dental clinic for significant dental work. Patient is given ED precautions to return to the ED for any worsening or new symptoms.     ____________________________________________  FINAL CLINICAL IMPRESSION(S) / ED DIAGNOSES  Final diagnoses:  Dental infection  Dental erosion extending into pulp  Dental caries      NEW MEDICATIONS STARTED DURING THIS VISIT:  New Prescriptions   AMOXICILLIN (AMOXIL) 875 MG TABLET    Take 1 tablet (875 mg total) by mouth 2 (two) times daily.   IBUPROFEN (ADVIL,MOTRIN) 800 MG TABLET    Take 1 tablet (800 mg total) by mouth every 8 (eight) hours as needed.   MAGIC MOUTHWASH W/LIDOCAINE SOLN    Take 5 mLs by mouth 4 (four) times daily.        This chart was dictated using voice recognition software/Dragon. Despite best efforts to proofread, errors can occur which can change the meaning. Any change was purely unintentional.    Racheal Patches, PA-C 11/06/15 2019  Emily Filbert, MD 11/06/15 2039

## 2015-11-06 NOTE — Discharge Instructions (Signed)
Dental Abscess °A dental abscess is a collection of pus in or around a tooth. °CAUSES °This condition is caused by a bacterial infection around the root of the tooth that involves the inner part of the tooth (pulp). It may result from: °· Severe tooth decay. °· Trauma to the tooth that allows bacteria to enter into the pulp, such as a broken or chipped tooth. °· Severe gum disease around a tooth. °SYMPTOMS °Symptoms of this condition include: °· Severe pain in and around the infected tooth. °· Swelling and redness around the infected tooth, in the mouth, or in the face. °· Tenderness. °· Pus drainage. °· Bad breath. °· Bitter taste in the mouth. °· Difficulty swallowing. °· Difficulty opening the mouth. °· Nausea. °· Vomiting. °· Chills. °· Swollen neck glands. °· Fever. °DIAGNOSIS °This condition is diagnosed with examination of the infected tooth. During the exam, your dentist may tap on the infected tooth. Your dentist will also ask about your medical and dental history and may order X-rays. °TREATMENT °This condition is treated by eliminating the infection. This may be done with: °· Antibiotic medicine. °· A root canal. This may be performed to save the tooth. °· Pulling (extracting) the tooth. This may also involve draining the abscess. This is done if the tooth cannot be saved. °HOME CARE INSTRUCTIONS °· Take medicines only as directed by your dentist. °· If you were prescribed antibiotic medicine, finish all of it even if you start to feel better. °· Rinse your mouth (gargle) often with salt water to relieve pain or swelling. °· Do not drive or operate heavy machinery while taking pain medicine. °· Do not apply heat to the outside of your mouth. °· Keep all follow-up visits as directed by your dentist. This is important. °SEEK MEDICAL CARE IF: °· Your pain is worse and is not helped by medicine. °SEEK IMMEDIATE MEDICAL CARE IF: °· You have a fever or chills. °· Your symptoms suddenly get worse. °· You have a  very bad headache. °· You have problems breathing or swallowing. °· You have trouble opening your mouth. °· You have swelling in your neck or around your eye. °  °This information is not intended to replace advice given to you by your health care provider. Make sure you discuss any questions you have with your health care provider. °  °Document Released: 04/04/2005 Document Revised: 08/19/2014 Document Reviewed: 04/01/2014 °Elsevier Interactive Patient Education ©2016 [employer]vier [employer] ° °Dental Care and Dentist Visits °Dental care supports good overall health. Regular dental visits can also help you avoid dental pain, bleeding, infection, and other more serious health problems in the future. It is important to keep the mouth healthy because diseases in the teeth, gums, and other oral tissues can spread to other areas of the body. Some problems, such as diabetes, heart disease, and pre-term labor have been associated with poor oral health.  °See your dentist every 6 months. If you experience emergency problems such as a toothache or broken tooth, go to the dentist right away. If you see your dentist regularly, you may catch problems early. It is easier to be treated for problems in the early stages.  °WHAT TO EXPECT AT A DENTIST VISIT  °Your dentist will look for many common oral health problems and recommend proper treatment. At your regular dental visit, you can expect: °· Gentle cleaning of the teeth and gums. This includes scraping and polishing. This helps to remove the sticky substance around the teeth and gums (  plaque). Plaque forms in the mouth shortly after eating. Over time, plaque hardens on the teeth as tartar. If tartar is not removed regularly, it can cause problems. Cleaning also helps remove stains.  Periodic X-rays. These pictures of the teeth and supporting bone will help your dentist assess the health of your teeth.  Periodic fluoride treatments. Fluoride is a natural mineral shown to help  strengthen teeth. Fluoride treatmentinvolves applying a fluoride gel or varnish to the teeth. It is most commonly done in children.  Examination of the mouth, tongue, jaws, teeth, and gums to look for any oral health problems, such as:  Cavities (dental caries). This is decay on the tooth caused by plaque, sugar, and acid in the mouth. It is best to catch a cavity when it is small.  Inflammation of the gums caused by plaque buildup (gingivitis).  Problems with the mouth or malformed or misaligned teeth.  Oral cancer or other diseases of the soft tissues or jaws. KEEP YOUR TEETH AND GUMS HEALTHY For healthy teeth and gums, follow these general guidelines as well as your dentist's specific advice:  Have your teeth professionally cleaned at the dentist every 6 months.  Brush twice daily with a fluoride toothpaste.  Floss your teeth daily.  Ask your dentist if you need fluoride supplements, treatments, or fluoride toothpaste.  Eat a healthy diet. Reduce foods and drinks with added sugar.  Avoid smoking. TREATMENT FOR ORAL HEALTH PROBLEMS If you have oral health problems, treatment varies depending on the conditions present in your teeth and gums.  Your caregiver will most likely recommend good oral hygiene at each visit.  For cavities, gingivitis, or other oral health disease, your caregiver will perform a procedure to treat the problem. This is typically done at a separate appointment. Sometimes your caregiver will refer you to another dental specialist for specific tooth problems or for surgery. SEEK IMMEDIATE DENTAL CARE IF:  You have pain, bleeding, or soreness in the gum, tooth, jaw, or mouth area.  A permanent tooth becomes loose or separated from the gum socket.  You experience a blow or injury to the mouth or jaw area.   This information is not intended to replace advice given to you by your health care provider. Make sure you discuss any questions you have with your  health care provider.   Document Released: 12/15/2010 Document Revised: 06/27/2011 Document Reviewed: 12/15/2010 Elsevier Interactive Patient Education 2016 [employer] Dental

## 2015-11-06 NOTE — ED Notes (Signed)
Pt c/o upper and lower L sided dental pain x 2 days. Pt denies relief from OTC ibuprofen. Pt has no insurance and no dentist.

## 2015-11-15 ENCOUNTER — Encounter: Payer: Self-pay | Admitting: Emergency Medicine

## 2015-11-15 ENCOUNTER — Emergency Department
Admission: EM | Admit: 2015-11-15 | Discharge: 2015-11-15 | Disposition: A | Discharge status: Home / Self Care | Payer: Self-pay | Attending: Emergency Medicine | Admitting: Emergency Medicine

## 2015-11-15 DIAGNOSIS — K047 Periapical abscess without sinus: Secondary | ICD-10-CM

## 2015-11-15 DIAGNOSIS — K089 Disorder of teeth and supporting structures, unspecified: Secondary | ICD-10-CM

## 2015-11-15 DIAGNOSIS — K029 Dental caries, unspecified: Secondary | ICD-10-CM

## 2015-11-15 DIAGNOSIS — Z79899 Other long term (current) drug therapy: Secondary | ICD-10-CM | POA: Insufficient documentation

## 2015-11-15 DIAGNOSIS — F17200 Nicotine dependence, unspecified, uncomplicated: Secondary | ICD-10-CM | POA: Insufficient documentation

## 2015-11-15 MED ORDER — CLINDAMYCIN HCL 300 MG PO CAPS: 300.0000 mg | ORAL_CAPSULE | Freq: Three times a day (TID) | ORAL | 0 refills | Status: DC

## 2015-11-15 MED ORDER — DEXAMETHASONE 2 MG PO TABS: ORAL_TABLET | ORAL | 0 refills | Status: DC

## 2015-11-15 NOTE — ED Triage Notes (Signed)
Patient from home via POV. C/O left lower tooth pain. Seen here prior for same and given amox. Patient states he completed his ABX course and it has not improved.

## 2015-11-15 NOTE — ED Provider Notes (Signed)
[facility] Emergency Department Provider Note  ____________________________________________  Time seen: Approximately 12:56 PM  I have reviewed the triage vital signs and the nursing notes.   HISTORY  Chief Complaint Dental Pain    HPI Bryan Bush is a 30 y.o. male , NAD, presents to the emergency department with left lower dental pain. Patient was seen in this emergency department approximately 9 days ago for the same complaint. States he took the medications as prescribed but believes the infection is still present in his gums. Patient notes he has not been able to schedule an appointment with a dentist due to his work schedule. Has not had any fevers, chills, body aches. Denies any swelling about his face or mouth. Has been able to eat and drink without significant difficulty other than pain about the left lower gumline. Has not had any numbness, weakness, tingling. Denies any headache or jaw pain.   History reviewed. No pertinent past medical history.  There are no active problems to display for this patient.   History reviewed. No pertinent surgical history.  Prior to Admission medications   Medication Sig Start Date End Date Taking? Authorizing Provider  clindamycin (CLEOCIN) 300 MG capsule Take 1 capsule (300 mg total) by mouth 3 (three) times daily. 11/15/15   Dequavius Kuhner L Sivan Quast, PA-C  dexamethasone (DECADRON) 2 MG tablet Take 6 tablets on Day 1 with food, then decrease by 1 tablet daily until finished (6,5,4,3,2,1) 11/15/15   Kash Mothershead L Mozella Rexrode, PA-C  diflunisal (DOLOBID) 500 MG TABS tablet Take 1 tablet (500 mg total) by mouth 2 (two) times daily. 03/14/15   Delorise Royals Cuthriell, PA-C  ibuprofen (ADVIL,MOTRIN) 800 MG tablet Take 1 tablet (800 mg total) by mouth every 8 (eight) hours as needed. 11/06/15   Delorise Royals Cuthriell, PA-C  lidocaine (XYLOCAINE) 2 % solution Use as directed 20 mLs in the mouth or throat as needed for mouth pain. 04/29/15   Evangeline Dakin, PA-C  magic mouthwash w/lidocaine SOLN Take 5 mLs by mouth 4 (four) times daily. 11/06/15   Delorise Royals Cuthriell, PA-C    Allergies Review of patient's allergies indicates no known allergies.  History reviewed. No pertinent family history.  Social History Social History  Substance Use Topics  . Smoking status: Current Some Day Smoker  . Smokeless tobacco: Never Used  . Alcohol use No     Review of Systems  Constitutional: No fever/chills, Fatigue ENT: Positive dental pain and swelling left lower teeth. No sore throat, difficulty swallowing. Cardiovascular: No chest pain. Respiratory:  No shortness of breath.  Musculoskeletal: Negative for TMJ or facial pain. Skin: Negative for rash, redness, skin sores, oozing, weeping. Neurological: Negative for headaches, focal weakness or numbness. No tingling 10-point ROS otherwise negative.  ____________________________________________   PHYSICAL EXAM:  VITAL SIGNS: ED Triage Vitals  Enc Vitals Group     BP 11/15/15 1236 123/78     Pulse Rate 11/15/15 1236 76     Resp 11/15/15 1236 16     Temp 11/15/15 1236 97.9 F (36.6 C)     Temp Source 11/15/15 1236 Oral     SpO2 11/15/15 1236 98 %     Weight --      Height --      Head Circumference --      Peak Flow --      Pain Score 11/15/15 1232 8     Pain Loc --      Pain Edu? --  Excl. in GC? --      Constitutional: Alert and oriented. Well appearing and in no acute distress. Eyes: Conjunctivae are normal.  Head: Atraumatic. ENT:      Nose: No congestion/rhinnorhea.      Mouth/Throat: Poor dentition throughout with multiple teeth that are discolored and with large cavernous cavities. Left lower posterior gumline with diffuse irritation but no swelling, oozing or weeping. Mucous membranes are moist. Pharynx without erythema, swelling, exudate. Neck: Supple with full range of motion. Hematological/Lymphatic/Immunilogical: No cervical  lymphadenopathy. Cardiovascular: Good peripheral circulation. Respiratory: Normal respiratory effort without tachypnea or retractions.  Musculoskeletal: No tenderness to palpation of the TMJ, zygomatic process or mandible. Full range of motion of the TMJ without pain, popping, locking. Neurologic:  Normal speech and language. No gross focal neurologic deficits are appreciated.  Skin:  Skin is warm, dry and intact. No rash or redness, swelling noted. Psychiatric: Mood and affect are normal. Speech and behavior are normal. Patient exhibits appropriate insight and judgement.   ____________________________________________   LABS  None ____________________________________________  EKG  None ____________________________________________  RADIOLOGY  None ____________________________________________    PROCEDURES  Procedure(s) performed: None   Procedures   Medications - No data to display   ____________________________________________   INITIAL IMPRESSION / ASSESSMENT AND PLAN / ED COURSE  Pertinent labs & imaging results that were available during my care of the patient were reviewed by me and considered in my medical decision making (see chart for details).  Clinical Course    Patient's diagnosis is consistent with Dental infection, pain due to dental caries and poor dentition. Patient will be discharged home with prescriptions for clindamycin and dexamethasone to take as directed. Patient advised not to take ibuprofen while on the Decadron but may take Tylenol as needed for pain. Patient is to follow up with the dental clinic at [facility] services in [facility] tomorrow for further evaluation and treatment. On discussion had with the patient in regards to need for dental care as continued use of antibiotics without appropriate to help care could lead to resistance. Patient is given ED precautions to return to the ED for any worsening or new symptoms.       ____________________________________________  FINAL CLINICAL IMPRESSION(S) / ED DIAGNOSES  Final diagnoses:  Dental infection  Pain due to dental caries  Poor dentition      NEW MEDICATIONS STARTED DURING THIS VISIT:  Discharge Medication List as of 11/15/2015  1:07 PM    START taking these medications   Details  clindamycin (CLEOCIN) 300 MG capsule Take 1 capsule (300 mg total) by mouth 3 (three) times daily., Starting Sun 11/15/2015, Print    dexamethasone (DECADRON) 2 MG tablet Take 6 tablets on Day 1 with food, then decrease by 1 tablet daily until finished (6,5,4,3,2,1), Print             Hope Pigeon, PA-C 11/15/15 1706    Minna Antis, MD 11/16/15 1511

## 2015-11-15 NOTE — ED Notes (Signed)
States he was seen about 1 1/2 weeks ago for dental pain/abscess . States he finished his antibiotic and feels like the infection is back

## 2015-12-08 ENCOUNTER — Encounter: Payer: Self-pay | Admitting: Emergency Medicine

## 2015-12-08 ENCOUNTER — Emergency Department
Admission: EM | Admit: 2015-12-08 | Discharge: 2015-12-08 | Disposition: A | Discharge status: Home / Self Care | Payer: Self-pay | Attending: Student | Admitting: Student

## 2015-12-08 DIAGNOSIS — Z79899 Other long term (current) drug therapy: Secondary | ICD-10-CM | POA: Insufficient documentation

## 2015-12-08 DIAGNOSIS — K047 Periapical abscess without sinus: Secondary | ICD-10-CM | POA: Insufficient documentation

## 2015-12-08 DIAGNOSIS — F17200 Nicotine dependence, unspecified, uncomplicated: Secondary | ICD-10-CM | POA: Insufficient documentation

## 2015-12-08 DIAGNOSIS — K029 Dental caries, unspecified: Secondary | ICD-10-CM | POA: Insufficient documentation

## 2015-12-08 MED ORDER — TRAMADOL HCL 50 MG PO TABS: 50.0000 mg | ORAL_TABLET | Freq: Three times a day (TID) | ORAL | 0 refills | Status: AC | PRN

## 2015-12-08 MED ORDER — PENICILLIN V POTASSIUM 500 MG PO TABS: 500.0000 mg | ORAL_TABLET | Freq: Four times a day (QID) | ORAL | 0 refills | Status: DC

## 2015-12-08 MED ORDER — PENICILLIN V POTASSIUM 500 MG PO TABS
500.0000 mg | ORAL_TABLET | Freq: Once | ORAL | Status: AC
  Administered 2015-12-08: 500 mg via ORAL
  Filled 2015-12-08: qty 1

## 2015-12-08 MED ORDER — TRAMADOL HCL 50 MG PO TABS
100.0000 mg | ORAL_TABLET | Freq: Once | ORAL | Status: AC
  Administered 2015-12-08: 100 mg via ORAL
  Filled 2015-12-08: qty 2

## 2015-12-08 MED ORDER — AMOXICILLIN-POT CLAVULANATE 875-125 MG PO TABS: 1.0000 | ORAL_TABLET | Freq: Two times a day (BID) | ORAL | 0 refills | Status: DC

## 2015-12-08 NOTE — ED Triage Notes (Signed)
Pt arrived to the ED for complaints of having a dental abscess on the bottom left side. Pt reports that this is recurrent and that he just finished a round of antibiotics.

## 2015-12-08 NOTE — ED Notes (Signed)
Discharge instructions reviewed with patient. Patient verbalized understanding. Patient ambulated to lobby without difficulty.   

## 2015-12-12 NOTE — ED Provider Notes (Signed)
[facility] Emergency Department Provider Note ____________________________________________  Time seen: 2023  I have reviewed the triage vital signs and the nursing notes.  HISTORY  Chief Complaint  Oral Swelling  HPI Bryan Bush is a 30 y.o. male visits to the ED for violation of recurrent dental abscess and infection to the lower jaw. Patient with poor dentition tension, dental decay, and gingivitis, reports for reevaluation of recurrent swelling to the right lower jaw. He describes being on 3 separate enemas over the last 3 months. He was most recently treated at [facility] and placed on penicillin. The patient describes recurrence of swelling even before the antibiotic was completely finished. Denies any interim fevers, chills, sweats. He also denies any spontaneous drainage at this time. He denies any medication allergies at this time. He reports being treated with clindamycin and amoxicillin, respectively.  History reviewed. No pertinent past medical history.  There are no active problems to display for this patient.  History reviewed. No pertinent surgical history.  Prior to Admission medications   Medication Sig Start Date End Date Taking? Authorizing Provider  amoxicillin-clavulanate (AUGMENTIN) 875-125 MG tablet Take 1 tablet by mouth 2 (two) times daily. 12/08/15   Cybele Maule V Bacon Furkan Keenum, PA-C  clindamycin (CLEOCIN) 300 MG capsule Take 1 capsule (300 mg total) by mouth 3 (three) times daily. 11/15/15   Jami L Hagler, PA-C  dexamethasone (DECADRON) 2 MG tablet Take 6 tablets on Day 1 with food, then decrease by 1 tablet daily until finished (6,5,4,3,2,1) 11/15/15   Jami L Hagler, PA-C  diflunisal (DOLOBID) 500 MG TABS tablet Take 1 tablet (500 mg total) by mouth 2 (two) times daily. 03/14/15   Delorise Royals Cuthriell, PA-C  ibuprofen (ADVIL,MOTRIN) 800 MG tablet Take 1 tablet (800 mg total) by mouth every 8 (eight) hours as needed. 11/06/15   Delorise Royals Cuthriell, PA-C   lidocaine (XYLOCAINE) 2 % solution Use as directed 20 mLs in the mouth or throat as needed for mouth pain. 04/29/15   Evangeline Dakin, PA-C  magic mouthwash w/lidocaine SOLN Take 5 mLs by mouth 4 (four) times daily. 11/06/15   Delorise Royals Cuthriell, PA-C  traMADol (ULTRAM) 50 MG tablet Take 1 tablet (50 mg total) by mouth 3 (three) times daily as needed. 12/08/15 12/15/15  Charlesetta Ivory Anasia Agro, PA-C   Allergies Review of patient's allergies indicates no known allergies.  History reviewed. No pertinent family history.  Social History Social History  Substance Use Topics  . Smoking status: Current Some Day Smoker  . Smokeless tobacco: Never Used  . Alcohol use No    Review of Systems  Constitutional: Negative for fever. Eyes: Negative for visual changes. ENT: Negative for sore throat. Dental pain and jaw swelling as above. CSkin: Negative for rash. Neurological: Negative for headaches, focal weakness or numbness. ____________________________________________  PHYSICAL EXAM:  VITAL SIGNS: ED Triage Vitals  Enc Vitals Group     BP 12/08/15 1917 131/89     Pulse Rate 12/08/15 1917 90     Resp 12/08/15 1917 18     Temp 12/08/15 1917 98.5 F (36.9 C)     Temp Source 12/08/15 1917 Oral     SpO2 12/08/15 1914 99 %     Weight 12/08/15 1918 165 lb (74.8 kg)     Height 12/08/15 1918 5\' 6"  (1.676 m)     Head Circumference --      Peak Flow --      Pain Score 12/08/15 1918 5  Pain Loc --      Pain Edu? --      Excl. in GC? --     Constitutional: Alert and oriented. Well appearing and in no distress. Head: Normocephalic and atraumatic.      Eyes: Conjunctivae are normal. PERRL. Normal extraocular movements      Ears: Canals clear. TMs intact bilaterally.   Nose: No congestion/rhinorrhea.   Mouth/Throat: Mucous membranes are moist.Uvula is midline and tonsils flat. Patient with generally poor dentition throughout. He is noted to have local edema to the anterior right jaw.  He is also noted to have some firmness over the premolar and first molar.   Neck: Supple. No thyromegaly. Hematological/Lymphatic/Immunological: No cervical lymphadenopathy. Skin:  Skin is warm, dry and intact. No rash noted. ____________________________________________  PROCEDURES  Pen VK 500 mg PO Ultram 50 mg PO ____________________________________________  INITIAL IMPRESSION / ASSESSMENT AND PLAN / ED COURSE  Patient with acute dental abscess secondary to dental caries. He is discharged with a prescription for Augmentin 2 doses directed. He is also provided with tramadol for acute pain relief. He will follow dental provider as discussed. Return precautions were reviewed.  Clinical Course   ____________________________________________  FINAL CLINICAL IMPRESSION(S) / ED DIAGNOSES  Final diagnoses:  Chronic enamel dental caries  Dental abscess      Lissa HoardJenise V Bacon Maddie Brazier, PA-C 12/12/15 1634    Gayla DossEryka A Gayle, MD 12/14/15 [phone]

## 2016-06-13 ENCOUNTER — Emergency Department
Admission: EM | Admit: 2016-06-13 | Discharge: 2016-06-13 | Disposition: A | Discharge status: Home / Self Care | Payer: Self-pay | Attending: Emergency Medicine | Admitting: Emergency Medicine

## 2016-06-13 DIAGNOSIS — F17200 Nicotine dependence, unspecified, uncomplicated: Secondary | ICD-10-CM | POA: Insufficient documentation

## 2016-06-13 DIAGNOSIS — K047 Periapical abscess without sinus: Secondary | ICD-10-CM | POA: Insufficient documentation

## 2016-06-13 MED ORDER — CLINDAMYCIN HCL 300 MG PO CAPS
300.0000 mg | ORAL_CAPSULE | Freq: Three times a day (TID) | ORAL | 0 refills | Status: AC
Start: 2016-06-13 — End: 2016-06-23

## 2016-06-13 MED ORDER — HYDROCODONE-ACETAMINOPHEN 5-325 MG PO TABS
2.0000 | ORAL_TABLET | Freq: Once | ORAL | Status: AC
  Administered 2016-06-13: 2 via ORAL
  Filled 2016-06-13: qty 2

## 2016-06-13 MED ORDER — TRAMADOL HCL 50 MG PO TABS: 50.0000 mg | ORAL_TABLET | Freq: Four times a day (QID) | ORAL | 0 refills | Status: DC | PRN

## 2016-06-13 MED ORDER — CLINDAMYCIN HCL 150 MG PO CAPS
300.0000 mg | ORAL_CAPSULE | Freq: Once | ORAL | Status: AC
  Administered 2016-06-13: 300 mg via ORAL
  Filled 2016-06-13: qty 2

## 2016-06-13 NOTE — ED Notes (Signed)
Pt given water to drink per Dr Awanda MinkPaduchowski's orders to encourage PO fluid intake.

## 2016-06-13 NOTE — ED Notes (Signed)
Pt given 8oz of apple juice at this time to further encourage PO fluid intake.

## 2016-06-13 NOTE — ED Provider Notes (Signed)
[facility] Emergency Department Provider Note  Time seen: 1:07 AM  I have reviewed the triage vital signs and the nursing notes.   HISTORY  Chief Complaint Oral Swelling    HPI Bryan Bush is a 31 y.o. male with no past medical history who presents to the emergency department with right lower molar dental pain. According to the patient he has had a fractured/decayed tooth in this area for many years however over the past 2 days and has become painful and he occasionally tastes a bad taste in his mouth today which he thinks might be pus. Denies any fever. Denies any vomiting.Patient states mild swelling to the area.  No past medical history on file.  There are no active problems to display for this patient.   No past surgical history on file.  Prior to Admission medications   Medication Sig Start Date End Date Taking? Authorizing Provider  amoxicillin-clavulanate (AUGMENTIN) 875-125 MG tablet Take 1 tablet by mouth 2 (two) times daily. 12/08/15   Jenise V Bacon Menshew, PA-C  clindamycin (CLEOCIN) 300 MG capsule Take 1 capsule (300 mg total) by mouth 3 (three) times daily. 11/15/15   Jami L Hagler, PA-C  dexamethasone (DECADRON) 2 MG tablet Take 6 tablets on Day 1 with food, then decrease by 1 tablet daily until finished (6,5,4,3,2,1) 11/15/15   Jami L Hagler, PA-C  diflunisal (DOLOBID) 500 MG TABS tablet Take 1 tablet (500 mg total) by mouth 2 (two) times daily. 03/14/15   Delorise RoyalsJonathan D Cuthriell, PA-C  ibuprofen (ADVIL,MOTRIN) 800 MG tablet Take 1 tablet (800 mg total) by mouth every 8 (eight) hours as needed. 11/06/15   Delorise RoyalsJonathan D Cuthriell, PA-C  lidocaine (XYLOCAINE) 2 % solution Use as directed 20 mLs in the mouth or throat as needed for mouth pain. 04/29/15   Evangeline Dakinharles M Beers, PA-C  magic mouthwash w/lidocaine SOLN Take 5 mLs by mouth 4 (four) times daily. 11/06/15   Delorise RoyalsJonathan D Cuthriell, PA-C    No Known Allergies  No family history on file.  Social  History Social History  Substance Use Topics  . Smoking status: Current Some Day Smoker  . Smokeless tobacco: Never Used  . Alcohol use No    Review of Systems Constitutional: Negative for fever. Cardiovascular: Negative for chest pain. Respiratory: Negative for shortness of breath. Gastrointestinal: Negative for abdominal pain, vomiting  Skin: Negative for rash. Neurological: Negative for headache 10-point ROS otherwise negative.  ____________________________________________   PHYSICAL EXAM:  VITAL SIGNS: ED Triage Vitals  Enc Vitals Group     BP 06/13/16 0043 (!) 134/92     Pulse Rate 06/13/16 0043 (!) 130     Resp 06/13/16 0043 18     Temp 06/13/16 0043 98.9 F (37.2 C)     Temp Source 06/13/16 0043 Oral     SpO2 06/13/16 0043 97 %     Weight 06/13/16 0044 170 lb (77.1 kg)     Height 06/13/16 0044 5\' 5"  (1.651 m)     Head Circumference --      Peak Flow --      Pain Score 06/13/16 0044 6     Pain Loc --      Pain Edu? --      Excl. in GC? --     Constitutional: Alert and oriented. Well appearing and in no distress. Eyes: Normal exam ENT   Head: Normocephalic and atraumatic   Mouth/Throat: Mucous membranes are moist.Poor dentition overall. Patient does have a fracture/decayed  right lower molar in which she is tender around the base. No signs of abscess. No appreciable facial swelling at this time. Cardiovascular: Regular rhythm, rate around 120 bpm. Respiratory: Normal respiratory effort without tachypnea nor retractions. Breath sounds are clear Gastrointestinal: Soft and nontender. No distention.  Musculoskeletal: Ambulates without issue Neurologic:  Normal speech and language. No gross focal neurologic deficits Skin:  Skin is warm, dry and intact.  Psychiatric: Mood and affect are normal. ____________________________________________   INITIAL IMPRESSION / ASSESSMENT AND PLAN / ED COURSE  Pertinent labs & imaging results that were available during  my care of the patient were reviewed by me and considered in my medical decision making (see chart for details).  The patient presents to the emergency department with right lower dental pain. Patient states for the past 2 days and has gotten inflamed and more painful he is now tasting a bad taste in his mouth which she believes is pus. No appreciable abscess on exam, no appreciable swelling on exam. We will start the patient on clindamycin in the emergency department. We will dose pain medication in the emergency department encourage fluids and closely monitor. The patient's heart rate is currently around 115 bpm but he does appear to be in mild discomfort.  Heart rate now down to 90 bpm. Patient much more comfortable after pain medication. We will discharge the clindamycin as well as a course of Ultram. I discussed with the patient return precautions for any increased swelling or any fever. Otherwise we will provide a list of dental clinics in the area and the patient states he will call today to arrange an appointment.  ____________________________________________   FINAL CLINICAL IMPRESSION(S) / ED DIAGNOSES  Right lower tooth pain Dental infection   Minna Antis, MD 06/13/16 [phone]

## 2016-06-13 NOTE — ED Notes (Signed)
Discharge instructions reviewed with patient. Questions fielded by this RN. Patient verbalizes understanding of instructions. Patient discharged home in stable condition per DR PADUCHOWSKI. No acute distress noted at time of discharge.    Pt given resources for dental clincs

## 2016-06-13 NOTE — ED Triage Notes (Signed)
Pt reports rt jaw swelling since this am, states that a lot of pus has come out.

## 2016-06-13 NOTE — Discharge Instructions (Signed)
Please call the numbers provided to arrange a follow-up appointment with dental clinic as soon as possible. Please fill and begin taking her antibiotics. Take your pain medication as needed. Return to the emergency department for significant increase in pain, increased swelling, or development of fever.   OPTIONS FOR DENTAL FOLLOW UP CARE  [place] [facility] - Local Safety Net Dental Clinics [email or web address]   [facility] [phone])  Sharl Ma [phone])  [facility] [phone])  [facility] [phone])  [facility] [phone]) This clinic caters to the indigent population and is on a lottery system. Location: [employer] of Dentistry, [employer], [address] [place] Clinic Hours: Wednesdays from 6pm - 9pm, patients seen by a lottery system. For dates, call or go to [email or web address] Services: Cleanings, fillings and simple extractions. Payment Options: DENTAL WORK IS FREE OF CHARGE. Bring proof of income or support. Best way to get seen: Arrive at 5:15 pm - this is a lottery, NOT first come/first serve, so arriving earlier will not increase your chances of being seen.     [facility] [phone] Select option 1 for emergencies   Location: [facility], [place], [address], [place] Clinic Hours: No walk-ins accepted - call the day before to schedule an appointment. Check in times are 9:30 am and 1:30 pm. Services: Simple extractions, temporary fillings, pulpectomy/pulp debridement, uncomplicated abscess drainage. Payment Options: PAYMENT IS DUE AT THE TIME OF SERVICE.  Fee is usually $100-200, additional surgical procedures (e.g. abscess drainage) may be extra. Cash, checks, Visa/MasterCard accepted.  Can file Medicaid if  patient is covered for dental - patient should call case worker to check. No discount for [facility] patients. Best way to get seen: MUST call the day before and get onto the schedule. Can usually be seen the next 1-2 days. No walk-ins accepted.     [facility] [phone]   Location: [facility], [address] [place] Clinic Hours: M, W, Th, F 8am or 1:30pm, Tues 9a or 1:30 - first come/first served. Services: Simple extractions, temporary fillings, uncomplicated abscess drainage.  You do not need to be an [facility] resident. Payment Options: PAYMENT IS DUE AT THE TIME OF SERVICE. Dental insurance, otherwise sliding scale - bring proof of income or support. Depending on income and treatment needed, cost is usually $50-200. Best way to get seen: Arrive early as it is first come/first served.     [facility] [phone]   Location: [address] Clinic Hours: Mon-Thu 8a-5p Services: Most basic dental services including extractions and fillings. Payment Options: PAYMENT IS DUE AT THE TIME OF SERVICE. Sliding scale, up to 50% off - bring proof if income or support. Medicaid with dental option accepted. Best way to get seen: Call to schedule an appointment, can usually be seen within 2 weeks OR they will try to see walk-ins - show up at 8a or 2p (you may have to wait).     [facility] [phone] [place] RESIDENTS ONLY   Location: [facility], [address], [place], [place] [address] Clinic Hours: By appointment only. Monday - Thursday 8am-5pm, Friday 8am-12pm Services: Cleanings, fillings, extractions. Payment Options: PAYMENT IS DUE AT THE TIME OF SERVICE. Cash, Visa or MasterCard. Sliding scale - $30 minimum per service. Best way to get seen: Come in to office,  complete packet and make an appointment - need proof of income or  support monies for each household member and proof of [place] residence. Usually takes about a month to get in.     [facility] [phone]   Location: [address] [facility] Hours: Walk-in Urgent Care Dental Services are offered Monday-Friday mornings only. The numbers of emergencies accepted daily is limited to the number of providers available. Maximum 15 - Mondays, Wednesdays & Thursdays Maximum 10 - Tuesdays & Fridays Services: You do not need to be [place] resident to be seen for a dental emergency. Emergencies are defined as pain, swelling, abnormal bleeding, or dental trauma. Walkins will receive x-rays if needed. NOTE: Dental cleaning is not an emergency. Payment Options: PAYMENT IS DUE AT THE TIME OF SERVICE. Minimum co-pay is $40.00 for uninsured patients. Minimum co-pay is $3.00 for Medicaid with dental coverage. Dental Insurance is accepted and must be presented at time of visit. Medicare does not cover dental. Forms of payment: Cash, credit card, checks. Best way to get seen: If not previously registered with the clinic, walk-in dental registration begins at 7:15 am and is on a first come/first serve basis. If previously registered with the clinic, call to make an appointment.     [facility] [phone] [place] RESIDENTS ONLY   Location: [address], [place], [place] Clinic Hours: Mon-Thu 10a-2p Services: Extractions only! Payment Options: FREE (donations accepted) - bring proof of income or support Best way to get seen: Call and schedule an appointment OR come at 8am on the 1st Monday of every month (except for holidays) when it is first come/first served.     [facility] [phone]   Location: [address], [place] Clinic Hours: Friday mornings Services, Payment Options, Best way to get seen: Call for info

## 2016-07-19 ENCOUNTER — Emergency Department
Admission: EM | Admit: 2016-07-19 | Discharge: 2016-07-19 | Disposition: A | Discharge status: Home / Self Care | Payer: Self-pay | Attending: Emergency Medicine | Admitting: Emergency Medicine

## 2016-07-19 ENCOUNTER — Encounter: Payer: Self-pay | Admitting: Emergency Medicine

## 2016-07-19 DIAGNOSIS — Z79899 Other long term (current) drug therapy: Secondary | ICD-10-CM | POA: Insufficient documentation

## 2016-07-19 DIAGNOSIS — F17200 Nicotine dependence, unspecified, uncomplicated: Secondary | ICD-10-CM | POA: Insufficient documentation

## 2016-07-19 DIAGNOSIS — K029 Dental caries, unspecified: Secondary | ICD-10-CM | POA: Insufficient documentation

## 2016-07-19 DIAGNOSIS — K0381 Cracked tooth: Secondary | ICD-10-CM | POA: Insufficient documentation

## 2016-07-19 DIAGNOSIS — K0889 Other specified disorders of teeth and supporting structures: Secondary | ICD-10-CM

## 2016-07-19 MED ORDER — AMOXICILLIN 500 MG PO CAPS: 500.0000 mg | ORAL_CAPSULE | Freq: Three times a day (TID) | ORAL | 0 refills | Status: DC

## 2016-07-19 MED ORDER — TRAMADOL HCL 50 MG PO TABS: 50.0000 mg | ORAL_TABLET | Freq: Four times a day (QID) | ORAL | 0 refills | Status: DC | PRN

## 2016-07-19 NOTE — ED Notes (Signed)
Pt from home with tooth /jaw pain on lower left side. Pt states he has not seen a dentist. Pt alert & oriented with NAD noted.

## 2016-07-19 NOTE — ED Notes (Signed)
Pt discharged home after verbalizing understanding of discharge instructions; nad noted. 

## 2016-07-19 NOTE — ED Provider Notes (Signed)
[facility] Emergency Department Provider Note   ____________________________________________   First MD Initiated Contact with Patient 07/19/16 1203     (approximate)  I have reviewed the triage vital signs and the nursing notes.   HISTORY  Chief Complaint Dental Pain    HPI Bryan Bush is a 31 y.o. male patient complaining of dental pain secondary to poor dental health. Paced the pain increase yesterday. Patient rates pain as a 10 over 10. No palliative measures for this complaint. Patient denies any facial edema. No palliative measures for complaint.   History reviewed. No pertinent past medical history.  There are no active problems to display for this patient.   History reviewed. No pertinent surgical history.  Prior to Admission medications   Medication Sig Start Date End Date Taking? Authorizing Provider  amoxicillin (AMOXIL) 500 MG capsule Take 1 capsule (500 mg total) by mouth 3 (three) times daily. 07/19/16   Joni Reining, PA-C  amoxicillin-clavulanate (AUGMENTIN) 875-125 MG tablet Take 1 tablet by mouth 2 (two) times daily. Patient not taking: Reported on 06/13/2016 12/08/15   Charlesetta Ivory Menshew, PA-C  dexamethasone (DECADRON) 2 MG tablet Take 6 tablets on Day 1 with food, then decrease by 1 tablet daily until finished (6,5,4,3,2,1) Patient not taking: Reported on 06/13/2016 11/15/15   Jami L Hagler, PA-C  diflunisal (DOLOBID) 500 MG TABS tablet Take 1 tablet (500 mg total) by mouth 2 (two) times daily. Patient not taking: Reported on 06/13/2016 03/14/15   Delorise Royals Cuthriell, PA-C  traMADol (ULTRAM) 50 MG tablet Take 1 tablet (50 mg total) by mouth every 6 (six) hours as needed. 06/13/16   Minna Antis, MD  traMADol (ULTRAM) 50 MG tablet Take 1 tablet (50 mg total) by mouth every 6 (six) hours as needed for moderate pain. 07/19/16   Joni Reining, PA-C    Allergies Patient has no known allergies.  No family history on  file.  Social History Social History  Substance Use Topics  . Smoking status: Current Some Day Smoker  . Smokeless tobacco: Never Used  . Alcohol use No    Review of Systems Constitutional: No fever/chills Eyes: No visual changes. ENT: No sore throat.Dental pain Cardiovascular: Denies chest pain. Respiratory: Denies shortness of breath. Gastrointestinal: No abdominal pain.  No nausea, no vomiting.  No diarrhea.  No constipation. Genitourinary: Negative for dysuria. Musculoskeletal: Negative for back pain. Skin: Negative for rash. Neurological: Negative for headaches, focal weakness or numbness.    ____________________________________________   PHYSICAL EXAM:  VITAL SIGNS: ED Triage Vitals [07/19/16 1151]  Enc Vitals Group     BP 124/77     Pulse Rate 82     Resp 18     Temp 97.8 F (36.6 C)     Temp Source Oral     SpO2 100 %     Weight 170 lb (77.1 kg)     Height  (1.651 m)     Head Circumference      Peak Flow      Pain Score 10     Pain Loc      Pain Edu?      Excl. in GC?     Constitutional: Alert and oriented. Well appearing and in no acute distress. Eyes: Conjunctivae are normal. PERRL. EOMI. Head: Atraumatic. Nose: No congestion/rhinnorhea. Mouth/Throat: Mucous membranes are moist. Multiple devitalize teeth upper and lower levels. Oropharynx non-erythematous. Mild gingival edema Neck: No stridor.  No cervical spine tenderness to  palpation. Hematological/Lymphatic/Immunilogical: No cervical lymphadenopathy. Cardiovascular: Normal rate, regular rhythm. Grossly normal heart sounds.  Good peripheral circulation. Respiratory: Normal respiratory effort.  No retractions. Lungs CTAB. Gastrointestinal: Soft and nontender. No distention. No abdominal bruits. No CVA tenderness. Musculoskeletal: No lower extremity tenderness nor edema.  No joint effusions. Neurologic:  Normal speech and language. No gross focal neurologic deficits are appreciated. No gait  instability. Skin:  Skin is warm, dry and intact. No rash noted. Psychiatric: Mood and affect are normal. Speech and behavior are normal.  ____________________________________________   LABS (all labs ordered are listed, but only abnormal results are displayed)  Labs Reviewed - No data to display ____________________________________________  EKG   ____________________________________________  RADIOLOGY   ____________________________________________   PROCEDURES  Procedure(s) performed: None  Procedures  Critical Care performed: No  ____________________________________________   INITIAL IMPRESSION / ASSESSMENT AND PLAN / ED COURSE  Pertinent labs & imaging results that were available during my care of the patient were reviewed by me and considered in my medical decision making (see chart for details).  Dental pain.      ____________________________________________   FINAL CLINICAL IMPRESSION(S) / ED DIAGNOSES  Final diagnoses:  Pain, dental   Dental pain secondary to multiple dental caries and fractures. Patient given discharge care instructions. Patient believe the emergency room for follow-up today with the walk-in dental clinic. Patient given prescription for tramadol for 3 days and amoxicillin.   NEW MEDICATIONS STARTED DURING THIS VISIT:  Discharge Medication List as of 07/19/2016 12:03 PM    START taking these medications   Details  amoxicillin (AMOXIL) 500 MG capsule Take 1 capsule (500 mg total) by mouth 3 (three) times daily., Starting Tue 07/19/2016, Print    !! traMADol (ULTRAM) 50 MG tablet Take 1 tablet (50 mg total) by mouth every 6 (six) hours as needed for moderate pain., Starting Tue 07/19/2016, Print     !! - Potential duplicate medications found. Please discuss with provider.       Note:  This document was prepared using Dragon voice recognition software and may include unintentional dictation errors.    Joni Reining,  PA-C 07/19/16 1216    Emily Filbert, MD 07/19/16 [phone]

## 2016-07-19 NOTE — ED Triage Notes (Signed)
Pt to ed with c/o toothache x 1 day, 10/10, no swelling noted to face at triage.

## 2016-07-21 ENCOUNTER — Encounter: Payer: Self-pay | Admitting: Emergency Medicine

## 2016-07-21 DIAGNOSIS — K047 Periapical abscess without sinus: Secondary | ICD-10-CM | POA: Insufficient documentation

## 2016-07-21 DIAGNOSIS — F17200 Nicotine dependence, unspecified, uncomplicated: Secondary | ICD-10-CM | POA: Insufficient documentation

## 2016-07-21 DIAGNOSIS — L03211 Cellulitis of face: Secondary | ICD-10-CM | POA: Insufficient documentation

## 2016-07-21 DIAGNOSIS — K029 Dental caries, unspecified: Secondary | ICD-10-CM | POA: Insufficient documentation

## 2016-07-21 NOTE — ED Triage Notes (Signed)
Pt ambulatory to triage in NAD, report dental infection on left side being treated with clindamycin by dentist, pt reports today increased swelling.  Swelling noted to pt's left jaw.

## 2016-07-22 ENCOUNTER — Encounter: Payer: Self-pay | Admitting: Radiology

## 2016-07-22 ENCOUNTER — Emergency Department: Payer: Self-pay

## 2016-07-22 ENCOUNTER — Emergency Department
Admission: EM | Admit: 2016-07-22 | Discharge: 2016-07-22 | Disposition: A | Discharge status: Home / Self Care | Payer: Self-pay | Attending: Emergency Medicine | Admitting: Emergency Medicine

## 2016-07-22 DIAGNOSIS — L03211 Cellulitis of face: Secondary | ICD-10-CM

## 2016-07-22 DIAGNOSIS — K029 Dental caries, unspecified: Secondary | ICD-10-CM

## 2016-07-22 DIAGNOSIS — K0889 Other specified disorders of teeth and supporting structures: Secondary | ICD-10-CM

## 2016-07-22 DIAGNOSIS — K047 Periapical abscess without sinus: Secondary | ICD-10-CM

## 2016-07-22 LAB — CBC WITH DIFFERENTIAL/PLATELET
BASOS ABS: 0 10*3/uL (ref 0–0.1)
Basophils Relative: 0 %
Eosinophils Absolute: 0.3 10*3/uL (ref 0–0.7)
Eosinophils Relative: 3 %
HCT: 45.4 % (ref 40.0–52.0)
HEMOGLOBIN: 15.4 g/dL (ref 13.0–18.0)
LYMPHS PCT: 18 %
Lymphs Abs: 2.1 10*3/uL (ref 1.0–3.6)
MCH: 30.9 pg (ref 26.0–34.0)
MCHC: 33.9 g/dL (ref 32.0–36.0)
MCV: 91 fL (ref 80.0–100.0)
MONO ABS: 1 10*3/uL (ref 0.2–1.0)
Monocytes Relative: 9 %
NEUTROS ABS: 7.9 10*3/uL — AB (ref 1.4–6.5)
Neutrophils Relative %: 70 %
PLATELETS: 213 10*3/uL (ref 150–440)
RBC: 4.99 MIL/uL (ref 4.40–5.90)
RDW: 14.2 % (ref 11.5–14.5)
WBC: 11.4 10*3/uL — ABNORMAL HIGH (ref 3.8–10.6)

## 2016-07-22 LAB — BASIC METABOLIC PANEL
Anion gap: 7 (ref 5–15)
BUN: 12 mg/dL (ref 6–20)
CALCIUM: 8.9 mg/dL (ref 8.9–10.3)
CHLORIDE: 103 mmol/L (ref 101–111)
CO2: 26 mmol/L (ref 22–32)
CREATININE: 0.94 mg/dL (ref 0.61–1.24)
Glucose, Bld: 108 mg/dL — ABNORMAL HIGH (ref 65–99)
Potassium: 3.7 mmol/L (ref 3.5–5.1)
SODIUM: 136 mmol/L (ref 135–145)

## 2016-07-22 MED ORDER — ONDANSETRON HCL 4 MG/2ML IJ SOLN
4.0000 mg | Freq: Once | INTRAMUSCULAR | Status: AC
  Administered 2016-07-22: 4 mg via INTRAVENOUS
  Filled 2016-07-22: qty 2

## 2016-07-22 MED ORDER — AMOXICILLIN-POT CLAVULANATE 875-125 MG PO TABS: 1.0000 | ORAL_TABLET | Freq: Two times a day (BID) | ORAL | 0 refills | Status: DC

## 2016-07-22 MED ORDER — OXYCODONE-ACETAMINOPHEN 5-325 MG PO TABS
1.0000 | ORAL_TABLET | Freq: Once | ORAL | Status: AC
  Administered 2016-07-22: 1 via ORAL
  Filled 2016-07-22: qty 1

## 2016-07-22 MED ORDER — OXYCODONE-ACETAMINOPHEN 5-325 MG PO TABS: 1.0000 | ORAL_TABLET | ORAL | 0 refills | Status: DC | PRN

## 2016-07-22 MED ORDER — SODIUM CHLORIDE 0.9 % IV BOLUS (SEPSIS)
1000.0000 mL | Freq: Once | INTRAVENOUS | Status: AC
  Administered 2016-07-22: 1000 mL via INTRAVENOUS

## 2016-07-22 MED ORDER — IOPAMIDOL (ISOVUE-300) INJECTION 61%
75.0000 mL | Freq: Once | INTRAVENOUS | Status: AC | PRN
  Administered 2016-07-22: 75 mL via INTRAVENOUS

## 2016-07-22 MED ORDER — MORPHINE SULFATE (PF) 4 MG/ML IV SOLN
4.0000 mg | Freq: Once | INTRAVENOUS | Status: AC
  Administered 2016-07-22: 4 mg via INTRAVENOUS
  Filled 2016-07-22: qty 1

## 2016-07-22 MED ORDER — SODIUM CHLORIDE 0.9 % IV SOLN
3.0000 g | Freq: Once | INTRAVENOUS | Status: AC
  Administered 2016-07-22: 3 g via INTRAVENOUS
  Filled 2016-07-22: qty 3

## 2016-07-22 NOTE — ED Provider Notes (Signed)
[facility] Emergency Department Provider Note   ____________________________________________   First MD Initiated Contact with Patient 07/22/16 0209     (approximate)  I have reviewed the triage vital signs and the nursing notes.   HISTORY  Chief Complaint Dental Pain    HPI Bryan Bush is a 31 y.o. male who presents to the ED from home with a chief complaint of left facial swelling. Patient was seen in the ED on 4/3 for dentalgia, widespread dental caries and placed on amoxicillin. He was referred that day and seen at [facility] who changed his antibiotic to clindamycin. He went to [facility] dental clinic the following day but was unable to be seen secondary to volume of patients and told to return next Wednesday. Noted left facial swelling yesterday which progressed throughout the day. Denies associated fever, chills, trouble swallowing secretions, chest pain, shortness of breath, abdominal pain, nausea, vomiting. Denies recent travel or trauma. Reports he has been taking clindamycin as prescribed since 4/3.   Past medical history None  There are no active problems to display for this patient.   History reviewed. No pertinent surgical history.  Prior to Admission medications   Medication Sig Start Date End Date Taking? Authorizing Provider  amoxicillin (AMOXIL) 500 MG capsule Take 1 capsule (500 mg total) by mouth 3 (three) times daily. 07/19/16   Joni Reining, PA-C  amoxicillin-clavulanate (AUGMENTIN) 875-125 MG tablet Take 1 tablet by mouth 2 (two) times daily. 07/22/16   Irean Hong, MD  dexamethasone (DECADRON) 2 MG tablet Take 6 tablets on Day 1 with food, then decrease by 1 tablet daily until finished (6,5,4,3,2,1) Patient not taking: Reported on 06/13/2016 11/15/15   Jami L Hagler, PA-C  diflunisal (DOLOBID) 500 MG TABS tablet Take 1 tablet (500 mg total) by mouth 2 (two) times daily. Patient not taking: Reported on 06/13/2016 03/14/15   Delorise Royals Cuthriell, PA-C  oxyCODONE-acetaminophen (ROXICET) 5-325 MG tablet Take 1 tablet by mouth every 4 (four) hours as needed for severe pain. 07/22/16   Irean Hong, MD  traMADol (ULTRAM) 50 MG tablet Take 1 tablet (50 mg total) by mouth every 6 (six) hours as needed. 06/13/16   Minna Antis, MD  traMADol (ULTRAM) 50 MG tablet Take 1 tablet (50 mg total) by mouth every 6 (six) hours as needed for moderate pain. 07/19/16   Joni Reining, PA-C    Allergies Patient has no known allergies.  History reviewed. No pertinent family history.  Social History Social History  Substance Use Topics  . Smoking status: Current Some Day Smoker  . Smokeless tobacco: Never Used  . Alcohol use No    Review of Systems  Constitutional: No fever/chills. Eyes: No visual changes. ENT: Positive for left toothache and facial swelling. No sore throat. Cardiovascular: Denies chest pain. Respiratory: Denies shortness of breath. Gastrointestinal: No abdominal pain.  No nausea, no vomiting.  No diarrhea.  No constipation. Genitourinary: Negative for dysuria. Musculoskeletal: Negative for back pain. Skin: Negative for rash. Neurological: Negative for headaches, focal weakness or numbness.  10-point ROS otherwise negative.  ____________________________________________   PHYSICAL EXAM:  VITAL SIGNS: ED Triage Vitals [07/21/16 2331]  Enc Vitals Group     BP 125/80     Pulse Rate (!) 102     Resp 18     Temp 98 F (36.7 C)     Temp Source Oral     SpO2 99 %     Weight 170 lb (  77.1 kg)     Height  (1.651 m)     Head Circumference      Peak Flow      Pain Score 10     Pain Loc      Pain Edu?      Excl. in GC?     Constitutional: Alert and oriented. Well appearing and in mild acute distress. Eyes: Conjunctivae are normal. PERRL. EOMI. Head: Moderate left facial swelling extending into the nasal bridge and under left eye. Nose: No congestion/rhinnorhea. Mouth/Throat: Widespread dental  caries with numerous broken teeth. Tolerating secretions well. Neck: No stridor.  No warmth/erythema. Supple neck. No symptoms to suggest Ludwig's angina. Cardiovascular: Normal rate, regular rhythm. Grossly normal heart sounds.  Good peripheral circulation. Respiratory: Normal respiratory effort.  No retractions. Lungs CTAB. Gastrointestinal: Soft and nontender. No distention. No abdominal bruits. No CVA tenderness. Musculoskeletal: No lower extremity tenderness nor edema.  No joint effusions. Neurologic:  Normal speech and language. No gross focal neurologic deficits are appreciated. No gait instability. Skin:  Skin is warm, dry and intact. No rash noted. Psychiatric: Mood and affect are normal. Speech and behavior are normal.  ____________________________________________   LABS (all labs ordered are listed, but only abnormal results are displayed)  Labs Reviewed  CBC WITH DIFFERENTIAL/PLATELET - Abnormal; Notable for the following:       Result Value   WBC 11.4 (*)    Neutro Abs 7.9 (*)    All other components within normal limits  BASIC METABOLIC PANEL - Abnormal; Notable for the following:    Glucose, Bld 108 (*)    All other components within normal limits  CULTURE, BLOOD (ROUTINE X 2)  CULTURE, BLOOD (ROUTINE X 2)   ____________________________________________  EKG  None ____________________________________________  RADIOLOGY  CT maxillofacial interpreted per Dr. Harrie Jeans: 1. Cortical defect of the outer left maxillary alveolar bone at the  level of periapical disease of the left second maxillary molar.  2. Associated 7 mm dental abscess overlying the left maxillary  alveolar bone.  3. Extensive soft tissue inflammation involving the left buccal  space, subcutaneous fat of the left lower cheek, and left  submandibular space. No extension of inflammatory changes into the  deep cervical spaces.    ____________________________________________   PROCEDURES  Procedure(s) performed: None  Procedures  Critical Care performed: No  ____________________________________________   INITIAL IMPRESSION / ASSESSMENT AND PLAN / ED COURSE  Pertinent labs & imaging results that were available during my care of the patient were reviewed by me and considered in my medical decision making (see chart for details).  31 year old male who presents with left facial swelling in the setting of dental infection on clindamycin 3 days. Will check screening lab work and obtain CT maxillofacial. Will administer IV Unasyn and analgesia.  Clinical Course as of Jul 23 619  Fri Jul 22, 2016  4098 Updated patient of CT imaging results. Recommended hospitalization for IV antibiotics. Patient declines, citing he has 2 young children to take care of and he has to "pick up his check". Will add Augmentin to clindamycin, prescription for analgesia and encouraged return for recheck in 12-24 hours. Strict return precautions given for sooner return. Patient verbalizes understanding and agrees with plan of care.  [JS]    Clinical Course User Index [JS] Irean Hong, MD     ____________________________________________   FINAL CLINICAL IMPRESSION(S) / ED DIAGNOSES  Final diagnoses:  Pain, dental  Infected dental caries  Dental abscess  Facial  cellulitis      NEW MEDICATIONS STARTED DURING THIS VISIT:  Discharge Medication List as of 07/22/2016  4:39 AM    START taking these medications   Details  oxyCODONE-acetaminophen (ROXICET) 5-325 MG tablet Take 1 tablet by mouth every 4 (four) hours as needed for severe pain., Starting Fri 07/22/2016, Print         Note:  This document was prepared using Dragon voice recognition software and may include unintentional dictation errors.    Irean Hong, MD 07/22/16 [phone]

## 2016-07-22 NOTE — ED Notes (Signed)
Pt c/o dental pain on the left side of mouth - pt jaw is swollen and he reports drainage - Dr Dolores Frame at bedside assessing pt

## 2016-07-22 NOTE — Discharge Instructions (Signed)
You have declined admission to the hospital. Please return if you change your mind. Start Augmentin 875 mg twice daily in addition to the Clindamycin you are already taking. You may take Percocet as needed for pain. Return to the ED in 12-24 hours for recheck. Return sooner for increased swelling, fever, vomiting or other concerns.

## 2016-07-27 LAB — CULTURE, BLOOD (ROUTINE X 2)
Culture: NO GROWTH
Culture: NO GROWTH
Special Requests: ADEQUATE

## 2016-08-09 ENCOUNTER — Emergency Department
Admission: EM | Admit: 2016-08-09 | Discharge: 2016-08-09 | Disposition: A | Discharge status: Home / Self Care | Payer: Self-pay | Attending: Emergency Medicine | Admitting: Emergency Medicine

## 2016-08-09 ENCOUNTER — Emergency Department
Admission: EM | Admit: 2016-08-09 | Discharge: 2016-08-10 | Disposition: A | Discharge status: Home / Self Care | Payer: Self-pay | Attending: Emergency Medicine | Admitting: Emergency Medicine

## 2016-08-09 ENCOUNTER — Encounter: Payer: Self-pay | Admitting: Emergency Medicine

## 2016-08-09 DIAGNOSIS — K047 Periapical abscess without sinus: Secondary | ICD-10-CM | POA: Insufficient documentation

## 2016-08-09 DIAGNOSIS — F17200 Nicotine dependence, unspecified, uncomplicated: Secondary | ICD-10-CM | POA: Insufficient documentation

## 2016-08-09 DIAGNOSIS — K0889 Other specified disorders of teeth and supporting structures: Secondary | ICD-10-CM

## 2016-08-09 DIAGNOSIS — K029 Dental caries, unspecified: Secondary | ICD-10-CM | POA: Insufficient documentation

## 2016-08-09 MED ORDER — CEPHALEXIN 500 MG PO CAPS
500.0000 mg | ORAL_CAPSULE | Freq: Once | ORAL | Status: AC
  Administered 2016-08-09: 500 mg via ORAL
  Filled 2016-08-09: qty 1

## 2016-08-09 MED ORDER — LIDOCAINE VISCOUS 2 % MT SOLN: 20.0000 mL | OROMUCOSAL | 0 refills | Status: DC | PRN

## 2016-08-09 MED ORDER — KETOROLAC TROMETHAMINE 60 MG/2ML IM SOLN
60.0000 mg | Freq: Once | INTRAMUSCULAR | Status: AC
  Administered 2016-08-09: 60 mg via INTRAMUSCULAR
  Filled 2016-08-09: qty 2

## 2016-08-09 MED ORDER — LIDOCAINE VISCOUS 2 % MT SOLN
15.0000 mL | Freq: Once | OROMUCOSAL | Status: AC
  Administered 2016-08-09: 15 mL via OROMUCOSAL
  Filled 2016-08-09: qty 15

## 2016-08-09 MED ORDER — CEPHALEXIN 500 MG PO CAPS: 500.0000 mg | ORAL_CAPSULE | Freq: Three times a day (TID) | ORAL | 0 refills | Status: AC

## 2016-08-09 NOTE — Discharge Instructions (Signed)
If you have increased pain, fever, vomiting, trouble breathing or you have significant bleeding or you feel worse in any way, return to the emergency department.    OPTIONS FOR DENTAL FOLLOW UP CARE  [place] [facility] - Local Safety Net Dental Clinics [email or web address]   [facility] [phone])  [facility] [phone])  [place] [phone])  [facility] [phone])  [facility] [phone]) This clinic caters to the indigent population and is on a lottery system. Location: [employer] of Dentistry, [employer], [address] [place] Clinic Hours: Wednesdays from 6pm - 9pm, patients seen by a lottery system. For dates, call or go to [email or web address] Services: Cleanings, fillings and simple extractions. Payment Options: DENTAL WORK IS FREE OF CHARGE. Bring proof of income or support. Best way to get seen: Arrive at 5:15 pm - this is a lottery, NOT first come/first serve, so arriving earlier will not increase your chances of being seen.     [facility] [phone] Select option 1 for emergencies   Location: [facility]istry, [place], [address], [place] Clinic Hours: No walk-ins accepted - call the day before to schedule an appointment. Check in times are 9:30 am and 1:30 pm. Services: Simple extractions, temporary fillings, pulpectomy/pulp debridement, uncomplicated abscess drainage. Payment Options: PAYMENT IS DUE AT THE TIME OF SERVICE.  Fee is usually $100-200, additional surgical procedures (e.g. abscess drainage) may be extra. Cash, checks, Visa/MasterCard accepted.  Can file Medicaid if patient is covered for dental - patient should call case worker to check. No discount for [facility] patients. Best way to get seen: MUST  call the day before and get onto the schedule. Can usually be seen the next 1-2 days. No walk-ins accepted.     [facility] [phone]   Location: [facility], [address] [place] Clinic Hours: M, W, Th, F 8am or 1:30pm, Tues 9a or 1:30 - first come/first served. Services: Simple extractions, temporary fillings, uncomplicated abscess drainage.  You do not need to be an [facility] resident. Payment Options: PAYMENT IS DUE AT THE TIME OF SERVICE. Dental insurance, otherwise sliding scale - bring proof of income or support. Depending on income and treatment needed, cost is usually $50-200. Best way to get seen: Arrive early as it is first come/first served.     [facility] [phone]   Location: [address] Clinic Hours: Mon-Thu 8a-5p Services: Most basic dental services including extractions and fillings. Payment Options: PAYMENT IS DUE AT THE TIME OF SERVICE. Sliding scale, up to 50% off - bring proof if income or support. Medicaid with dental option accepted. Best way to get seen: Call to schedule an appointment, can usually be seen within 2 weeks OR they will try to see walk-ins - show up at 8a or 2p (you may have to wait).     [facility] [phone] [place] RESIDENTS ONLY   Location: [facility], [address] [place], [place] [address] Clinic Hours: By appointment only. Monday - Thursday 8am-5pm, Friday 8am-12pm Services: Cleanings, fillings, extractions. Payment Options: PAYMENT IS DUE AT THE TIME OF SERVICE. Cash, Visa or MasterCard. Sliding scale - $30 minimum per service. Best way to get seen: Come in to office, complete packet and make an appointment - need proof of income or support monies for each household member and  proof of [facility] residence. Usually takes about a month to get in.     [facility]  Dental Clinic [phone]   Location: [address] [facility] Clinic Hours: Walk-in Urgent Care Dental Services are offered Monday-Friday mornings only. The numbers of emergencies accepted daily is limited to the number of providers available. Maximum 15 - Mondays, Wednesdays & Thursdays Maximum 10 - Tuesdays & Fridays Services: You do not need to be a [facility] resident to be seen for a dental emergency. Emergencies are defined as pain, swelling, abnormal bleeding, or dental trauma. Walkins will receive x-rays if needed. NOTE: Dental cleaning is not an emergency. Payment Options: PAYMENT IS DUE AT THE TIME OF SERVICE. Minimum co-pay is $40.00 for uninsured patients. Minimum co-pay is $3.00 for Medicaid with dental coverage. Dental Insurance is accepted and must be presented at time of visit. Medicare does not cover dental. Forms of payment: Cash, credit card, checks. Best way to get seen: If not previously registered with the clinic, walk-in dental registration begins at 7:15 am and is on a first come/first serve basis. If previously registered with the clinic, call to make an appointment.     [facility] [phone] [place] RESIDENTS ONLY   Location: [address], [place], [place] Clinic Hours: Mon-Thu 10a-2p Services: Extractions only! Payment Options: FREE (donations accepted) - bring proof of income or support Best way to get seen: Call and schedule an appointment OR come at 8am on the 1st Monday of every month (except for holidays) when it is first come/first served.     [facility] [phone]   Location: [address] [place], [place] Clinic Hours: Friday mornings Services, Payment Options, Best way to get seen: Call for info

## 2016-08-09 NOTE — ED Triage Notes (Signed)
Pt ambulatory to triage, no distress noted, reports left lower dental pain and swelling x1 day. Pt denies fever, denies drainage.

## 2016-08-09 NOTE — ED Triage Notes (Signed)
Patient states that he was seen here earlier for dental abscess. Patient states that he was started on antibiotics. Patient states that the abscess has started bleeding so he wanted to have it rechecked.

## 2016-08-09 NOTE — ED Provider Notes (Signed)
[facility] Emergency Department Provider Note  ____________________________________________   I have reviewed the triage vital signs and the nursing notes.   HISTORY  Chief Complaint Dental Pain    HPI Bryan Bush is a 31 y.o. male  was seen this morning for dental abscess. He has been given antibiotics which she is taking. He had a little bit of blood come out from [place] abscesses and he was concerned about it so he came back. He has no swelling under the tongue, no difficulty breathing or swallowing, he has no fever, he has no chills he is eating and drinking with no difficulty, the abscess feels smaller to him.      History reviewed. No pertinent past medical history.  There are no active problems to display for this patient.   History reviewed. No pertinent surgical history.  Prior to Admission medications   Medication Sig Start Date End Date Taking? Authorizing Provider  amoxicillin (AMOXIL) 500 MG capsule Take 1 capsule (500 mg total) by mouth 3 (three) times daily. 07/19/16   Joni Reining, PA-C  amoxicillin-clavulanate (AUGMENTIN) 875-125 MG tablet Take 1 tablet by mouth 2 (two) times daily. 07/22/16   Irean Hong, MD  cephALEXin (KEFLEX) 500 MG capsule Take 1 capsule (500 mg total) by mouth 3 (three) times daily. 08/09/16 08/14/16  Rebecka Apley, MD  dexamethasone (DECADRON) 2 MG tablet Take 6 tablets on Day 1 with food, then decrease by 1 tablet daily until finished (6,5,4,3,2,1) Patient not taking: Reported on 06/13/2016 11/15/15   Jami L Hagler, PA-C  diflunisal (DOLOBID) 500 MG TABS tablet Take 1 tablet (500 mg total) by mouth 2 (two) times daily. Patient not taking: Reported on 06/13/2016 03/14/15   Delorise Royals Cuthriell, PA-C  lidocaine (XYLOCAINE) 2 % solution Use as directed 20 mLs in the mouth or throat as needed for mouth pain. 08/09/16   Rebecka Apley, MD  oxyCODONE-acetaminophen (ROXICET) 5-325 MG tablet Take 1 tablet by mouth  every 4 (four) hours as needed for severe pain. 07/22/16   Irean Hong, MD  traMADol (ULTRAM) 50 MG tablet Take 1 tablet (50 mg total) by mouth every 6 (six) hours as needed. 06/13/16   Minna Antis, MD  traMADol (ULTRAM) 50 MG tablet Take 1 tablet (50 mg total) by mouth every 6 (six) hours as needed for moderate pain. 07/19/16   Joni Reining, PA-C    Allergies Patient has no known allergies.  No family history on file.  Social History Social History  Substance Use Topics  . Smoking status: Current Some Day Smoker  . Smokeless tobacco: Never Used  . Alcohol use No    Review of Systems   ____________________________________________   PHYSICAL EXAM:  VITAL SIGNS: ED Triage Vitals [08/09/16 2152]  Enc Vitals Group     BP 136/89     Pulse Rate 80     Resp 18     Temp 98.4 F (36.9 C)     Temp Source Oral     SpO2 99 %     Weight 170 lb (77.1 kg)     Height  (1.651 m)     Head Circumference      Peak Flow      Pain Score 5     Pain Loc      Pain Edu?      Excl. in GC?     Constitutional: Alert and oriented. Well appearing and in no acute distress. Eyes:  Conjunctivae are normal. PERRL. EOMI. Head: Atraumatic. Nose: No congestion/rhinnorhea. Mouth/Throat: Mucous membranes are moist.  She does have some swelling around the lower mandibular molar around tooth 19. There is no active bleeding at this time there is no significant purulence. There is no swelling under the tongue to suggest Ludwig's angina, he has normal voice with no stridor, there is no significant swelling there is no significant lymphadenopathy noted Neck: No stridor.   Nontender with no meningismus Cardiovascular: Normal rate, regular rhythm. Grossly normal heart sounds.  Good peripheral circulation. Respiratory: Normal respiratory effort.  No retractions. Lungs CTAB. Psychiatric: Mood and affect are normal. Speech and behavior are normal.  ____________________________________________    LABS (all labs ordered are listed, but only abnormal results are displayed)  Labs Reviewed - No data to display ____________________________________________  EKG  I personally interpreted any EKGs ordered by me or triage  ____________________________________________  RADIOLOGY  I reviewed any imaging ordered by me or triage that were performed during my shift and, if possible, patient and/or family made aware of any abnormal findings. ____________________________________________   PROCEDURES  Procedure(s) performed: None  Procedures  Critical Care performed: None  ____________________________________________   INITIAL IMPRESSION / ASSESSMENT AND PLAN / ED COURSE  Pertinent labs & imaging results that were available during my care of the patient were reviewed by me and considered in my medical decision making (see chart for details).  Patient here with a dental abscess apparently had a little bit of bleeding earlier, there is nothing at this time clinically that I can drain, or is there or any evidence of any couple occasions of dental abscess such as Ludwig angina, or airway issue etc. Patient is to continue taking the antibiotics which apparently are actually helping his symptoms and he will follow closely with dentistry. Return precautions and follow-up given and understood.    ____________________________________________   FINAL CLINICAL IMPRESSION(S) / ED DIAGNOSES  Final diagnoses:  None      This chart was dictated using voice recognition software.  Despite best efforts to proofread,  errors can occur which can change meaning.      Jeanmarie Plant, MD 08/09/16 2350

## 2016-08-09 NOTE — ED Provider Notes (Signed)
[facility] Emergency Department Provider Note   ____________________________________________   First MD Initiated Contact with Patient 08/09/16 [phone]     (approximate)  I have reviewed the triage vital signs and the nursing notes.   HISTORY  Chief Complaint Dental Pain    HPI Bryan Bush is a 31 y.o. male who comes into the hospital today with a toothache. The patient reports that he thinks it's infected and swelling along the foot. He reports that the pain started on Friday night. It's been on and off but the pain seemed to worsen last night. The patient reports that when he woke up this morning around 4 to 4:30 he noticed that it was swollen more than it had been. The patient denies any fevers. He rates his pain 8 out of 10 in intensity. He has been alternating between Tylenol, ibuprofen and aspirin. The patient states that he has a dental appointment on Monday and he needs about 2 or 3 of his teeth pulled on the left mandibular jaw. The patient reports that he was prescribed clindamycin by his dentist a few weeks ago and it didn't help but amoxicillin did help with his pain. He is here today for evaluation.   History reviewed. No pertinent past medical history.  There are no active problems to display for this patient.   History reviewed. No pertinent surgical history.  Prior to Admission medications   Medication Sig Start Date End Date Taking? Authorizing Provider  amoxicillin (AMOXIL) 500 MG capsule Take 1 capsule (500 mg total) by mouth 3 (three) times daily. 07/19/16   Joni Reining, PA-C  amoxicillin-clavulanate (AUGMENTIN) 875-125 MG tablet Take 1 tablet by mouth 2 (two) times daily. 07/22/16   Irean Hong, MD  cephALEXin (KEFLEX) 500 MG capsule Take 1 capsule (500 mg total) by mouth 3 (three) times daily. 08/09/16 08/14/16  Rebecka Apley, MD  dexamethasone (DECADRON) 2 MG tablet Take 6 tablets on Day 1 with food, then decrease by 1 tablet daily  until finished (6,5,4,3,2,1) Patient not taking: Reported on 06/13/2016 11/15/15   Jami L Hagler, PA-C  diflunisal (DOLOBID) 500 MG TABS tablet Take 1 tablet (500 mg total) by mouth 2 (two) times daily. Patient not taking: Reported on 06/13/2016 03/14/15   Delorise Royals Cuthriell, PA-C  lidocaine (XYLOCAINE) 2 % solution Use as directed 20 mLs in the mouth or throat as needed for mouth pain. 08/09/16   Rebecka Apley, MD  oxyCODONE-acetaminophen (ROXICET) 5-325 MG tablet Take 1 tablet by mouth every 4 (four) hours as needed for severe pain. 07/22/16   Irean Hong, MD  traMADol (ULTRAM) 50 MG tablet Take 1 tablet (50 mg total) by mouth every 6 (six) hours as needed. 06/13/16   Minna Antis, MD  traMADol (ULTRAM) 50 MG tablet Take 1 tablet (50 mg total) by mouth every 6 (six) hours as needed for moderate pain. 07/19/16   Joni Reining, PA-C    Allergies Patient has no known allergies.  History reviewed. No pertinent family history.  Social History Social History  Substance Use Topics  . Smoking status: Current Some Day Smoker  . Smokeless tobacco: Never Used  . Alcohol use No    Review of Systems  Constitutional: No fever/chills Eyes: No visual changes. ENT: dental pain Cardiovascular: Denies chest pain. Respiratory: Denies shortness of breath. Gastrointestinal: No abdominal pain.  No nausea, no vomiting.  No diarrhea.  No constipation. Genitourinary: Negative for dysuria. Musculoskeletal: Negative for back pain.  Skin: Negative for rash. Neurological: Negative for headaches, focal weakness or numbness.   ____________________________________________   PHYSICAL EXAM:  VITAL SIGNS: ED Triage Vitals [08/09/16 0531]  Enc Vitals Group     BP 128/84     Pulse Rate 80     Resp 16     Temp 98.2 F (36.8 C)     Temp Source Oral     SpO2 97 %     Weight 170 lb (77.1 kg)     Height      Head Circumference      Peak Flow      Pain Score 8     Pain Loc      Pain Edu?       Excl. in GC?     Constitutional: Alert and oriented. Well appearing and in mild distress. Eyes: Conjunctivae are normal. PERRL. EOMI. Head: Atraumatic. Nose: No congestion/rhinnorhea. Mouth/Throat: Mucous membranes are moist.  Oropharynx non-erythematous. Poor dentition with multiple dental caries, TTP of left mandibular 2nd molar Hematological/Lymphatic/Immunilogical: left sided cervical lymphadenopathy. Cardiovascular: Normal rate, regular rhythm. Grossly normal heart sounds.  Good peripheral circulation. Respiratory: Normal respiratory effort.  No retractions. Lungs CTAB. Gastrointestinal: Soft and nontender. No distention. Positive bowel sounds Musculoskeletal: No lower extremity tenderness nor edema.   Neurologic:  Normal speech and language.  Skin:  Skin is warm, dry and intact.  Psychiatric: Mood and affect are normal.   ____________________________________________   LABS (all labs ordered are listed, but only abnormal results are displayed)  Labs Reviewed - No data to display ____________________________________________  EKG  none ____________________________________________  RADIOLOGY  none ____________________________________________   PROCEDURES  Procedure(s) performed: None  Procedures  Critical Care performed: No  ____________________________________________   INITIAL IMPRESSION / ASSESSMENT AND PLAN / ED COURSE  Pertinent labs & imaging results that were available during my care of the patient were reviewed by me and considered in my medical decision making (see chart for details).  This is a 31 year old male who comes into the hospital today with some dental pain. The patient has poor dentition with multiple dental caries. I will give the patient a shot of Toradol as well as some viscous lidocaine and Keflex. The patient will be discharged home to follow-up with his dentist for further evaluation as well as dental extractions.       ____________________________________________   FINAL CLINICAL IMPRESSION(S) / ED DIAGNOSES  Final diagnoses:  Pain, dental  Dental caries      NEW MEDICATIONS STARTED DURING THIS VISIT:  New Prescriptions   CEPHALEXIN (KEFLEX) 500 MG CAPSULE    Take 1 capsule (500 mg total) by mouth 3 (three) times daily.   LIDOCAINE (XYLOCAINE) 2 % SOLUTION    Use as directed 20 mLs in the mouth or throat as needed for mouth pain.     Note:  This document was prepared using Dragon voice recognition software and may include unintentional dictation errors.    Rebecka Apley, MD 08/09/16 [phone]

## 2017-01-13 ENCOUNTER — Emergency Department
Admission: EM | Admit: 2017-01-13 | Discharge: 2017-01-13 | Disposition: A | Discharge status: Home / Self Care | Payer: Self-pay | Attending: Emergency Medicine | Admitting: Emergency Medicine

## 2017-01-13 ENCOUNTER — Encounter: Payer: Self-pay | Admitting: Emergency Medicine

## 2017-01-13 DIAGNOSIS — Z79899 Other long term (current) drug therapy: Secondary | ICD-10-CM | POA: Insufficient documentation

## 2017-01-13 DIAGNOSIS — R109 Unspecified abdominal pain: Secondary | ICD-10-CM

## 2017-01-13 DIAGNOSIS — F17210 Nicotine dependence, cigarettes, uncomplicated: Secondary | ICD-10-CM | POA: Insufficient documentation

## 2017-01-13 DIAGNOSIS — R1084 Generalized abdominal pain: Secondary | ICD-10-CM | POA: Insufficient documentation

## 2017-01-13 DIAGNOSIS — R531 Weakness: Secondary | ICD-10-CM | POA: Insufficient documentation

## 2017-01-13 DIAGNOSIS — R42 Dizziness and giddiness: Secondary | ICD-10-CM | POA: Insufficient documentation

## 2017-01-13 DIAGNOSIS — R197 Diarrhea, unspecified: Secondary | ICD-10-CM

## 2017-01-13 LAB — LIPASE, BLOOD: Lipase: 20 U/L (ref 11–51)

## 2017-01-13 LAB — COMPREHENSIVE METABOLIC PANEL
ALK PHOS: 60 U/L (ref 38–126)
ALT: 22 U/L (ref 17–63)
AST: 38 U/L (ref 15–41)
Albumin: 4.7 g/dL (ref 3.5–5.0)
Anion gap: 12 (ref 5–15)
BILIRUBIN TOTAL: 1 mg/dL (ref 0.3–1.2)
BUN: 22 mg/dL — AB (ref 6–20)
CALCIUM: 9.6 mg/dL (ref 8.9–10.3)
CO2: 24 mmol/L (ref 22–32)
CREATININE: 1.2 mg/dL (ref 0.61–1.24)
Chloride: 99 mmol/L — ABNORMAL LOW (ref 101–111)
GFR calc Af Amer: >60 mL/min (ref >60)
GLUCOSE: 143 mg/dL — AB (ref 65–99)
POTASSIUM: 3.1 mmol/L — AB (ref 3.5–5.1)
Sodium: 135 mmol/L (ref 135–145)
Total Protein: 8.7 g/dL — ABNORMAL HIGH (ref 6.5–8.1)

## 2017-01-13 LAB — CBC
HCT: 48.8 % (ref 40.0–52.0)
Hemoglobin: 16.8 g/dL (ref 13.0–18.0)
MCH: 31.5 pg (ref 26.0–34.0)
MCHC: 34.4 g/dL (ref 32.0–36.0)
MCV: 91.6 fL (ref 80.0–100.0)
PLATELETS: 236 10*3/uL (ref 150–440)
RBC: 5.33 MIL/uL (ref 4.40–5.90)
RDW: 14 % (ref 11.5–14.5)
WBC: 9.6 10*3/uL (ref 3.8–10.6)

## 2017-01-13 MED ORDER — SODIUM CHLORIDE 0.9 % IV BOLUS (SEPSIS)
1000.0000 mL | Freq: Once | INTRAVENOUS | Status: AC
  Administered 2017-01-13: 1000 mL via INTRAVENOUS

## 2017-01-13 MED ORDER — SODIUM CHLORIDE 0.9 % IV BOLUS (SEPSIS): 1000.0000 mL | Freq: Once | INTRAVENOUS | Status: DC

## 2017-01-13 NOTE — Discharge Instructions (Signed)
Please return for worse pain fever or unable to keep down fluids. Use Pepto-Bismol if the diarrhea continues. If you not any better after 3 or 4 doses of Pepto-Bismol return to the emergency room.

## 2017-01-13 NOTE — ED Triage Notes (Signed)
Pt in via POV with complaints of intermittent lower abdominal pain and diarrhea x 2 days.  Pt appears pale and diaphoretic in triage, pt tachycardic in 130's, other vitals WDL.

## 2017-01-13 NOTE — ED Notes (Signed)
Pt has been in bathroom having diarrhea since walking back from waiting room. Checked on pt in bathroom and he said he was doing ok.

## 2017-01-13 NOTE — ED Notes (Signed)
Pt appearing tired. Dr. Darnelle Catalan wanted pt moved to room to let pt rest. Moved to 18, lights dimmed. Blanket given. Hates placed in toilet to attempt stool collection. Pt stated he doesn't know if he has any stool left, states he hasn't eaten since yesterday.

## 2017-01-13 NOTE — ED Notes (Signed)
Pt c/o of watery diarrhea since yesterday. States 2 episodes today but more yesterday. States he works at [employer] so might have picked up something there. No pain. No vomiting or fever. Alert and oriented. Ambulatory. States he is tired.

## 2017-01-13 NOTE — ED Provider Notes (Signed)
[facility] Emergency Department Provider Note   ____________________________________________   First MD Initiated Contact with Patient 01/13/17 1445     (approximate)  I have reviewed the triage vital signs and the nursing notes.   HISTORY  Chief Complaint Abdominal Pain and Diarrhea    HPI Bryan Bush is a 31 y.o. male Comes in saying he had diarrhea yesterday and today. He feels crampy abdominal pain and is weak and lightheaded. Heart rate was in the 130s. He was sent home from work today because he felt so bad. He had a stool on arrival here but since then has not had any further stools after having gotten 2 L of fluid his heart rate comes down he feels well and wants to go home. He's been in the ER for well over an hour without any stools I will let him go home.  History reviewed. No pertinent past medical history.  There are no active problems to display for this patient.   History reviewed. No pertinent surgical history.  Prior to Admission medications   Medication Sig Start Date End Date Taking? Authorizing Provider  amoxicillin (AMOXIL) 500 MG capsule Take 1 capsule (500 mg total) by mouth 3 (three) times daily. Patient not taking: Reported on 01/13/2017 07/19/16   Joni Reining, PA-C  amoxicillin-clavulanate (AUGMENTIN) 875-125 MG tablet Take 1 tablet by mouth 2 (two) times daily. Patient not taking: Reported on 01/13/2017 07/22/16   Irean Hong, MD  dexamethasone (DECADRON) 2 MG tablet Take 6 tablets on Day 1 with food, then decrease by 1 tablet daily until finished (6,5,4,3,2,1) Patient not taking: Reported on 06/13/2016 11/15/15   Hagler, Jami L, PA-C  diflunisal (DOLOBID) 500 MG TABS tablet Take 1 tablet (500 mg total) by mouth 2 (two) times daily. Patient not taking: Reported on 06/13/2016 03/14/15   Cuthriell, Delorise Royals, PA-C  lidocaine (XYLOCAINE) 2 % solution Use as directed 20 mLs in the mouth or throat as needed for mouth  pain. Patient not taking: Reported on 01/13/2017 08/09/16   Rebecka Apley, MD  oxyCODONE-acetaminophen (ROXICET) 5-325 MG tablet Take 1 tablet by mouth every 4 (four) hours as needed for severe pain. Patient not taking: Reported on 01/13/2017 07/22/16   Irean Hong, MD  traMADol (ULTRAM) 50 MG tablet Take 1 tablet (50 mg total) by mouth every 6 (six) hours as needed. Patient not taking: Reported on 01/13/2017 06/13/16   Minna Antis, MD  traMADol (ULTRAM) 50 MG tablet Take 1 tablet (50 mg total) by mouth every 6 (six) hours as needed for moderate pain. Patient not taking: Reported on 01/13/2017 07/19/16   Joni Reining, PA-C    Allergies Patient has no known allergies.  No family history on file.  Social History Social History  Substance Use Topics  . Smoking status: Current Every Day Smoker    Packs/day: 0.50    Types: Cigarettes  . Smokeless tobacco: Never Used  . Alcohol use No    Review of Systems  Constitutional: No fever/chills Eyes: No visual changes. ENT: No sore throat. Cardiovascular: Denies chest pain. Respiratory: Denies shortness of breath. Gastrointestinal: see history of present illness Genitourinary: Negative for dysuria. Musculoskeletal: Negative for back pain. Skin: Negative for rash. Neurological: Negative for headaches, focal weakness   ____________________________________________   PHYSICAL EXAM:  VITAL SIGNS: ED Triage Vitals  Enc Vitals Group     BP 01/13/17 1347 124/86     Pulse Rate 01/13/17 1347 (!) 137  Resp 01/13/17 1347 18     Temp 01/13/17 1347 98 F (36.7 C)     Temp Source 01/13/17 1347 Oral     SpO2 01/13/17 1347 98 %     Weight 01/13/17 1348 170 lb (77.1 kg)     Height 01/13/17 1348  (1.651 m)     Head Circumference --      Peak Flow --      Pain Score 01/13/17 1347 0     Pain Loc --      Pain Edu? --      Excl. in GC? --     Constitutional: Alert and oriented. Well appearing and in no acute distress. Eyes:  Conjunctivae are normal. PERRL. EOMI. Head: Atraumatic. Nose: No congestion/rhinnorhea. Mouth/Throat: Mucous membranes are moist.  Oropharynx non-erythematous. Neck: No stridor.  Cardiovascular: initially tachycardic laterNormal rate, regular rhythm. Grossly normal heart sounds.  Good peripheral circulation. Respiratory: Normal respiratory effort.  No retractions. Lungs CTAB. Gastrointestinal: Soft and nontender. No distention. No abdominal bruits. No CVA tenderness. Musculoskeletal: No lower extremity tenderness nor edema.  No joint effusions. Neurologic:  Normal speech and language. No gross focal neurologic deficits are appreciated. No gait instability. Skin:  Skin is warm, dry and intact. No rash noted. Psychiatric: Mood and affect are normal. Speech and behavior are normal.  ____________________________________________   LABS (all labs ordered are listed, but only abnormal results are displayed)  Labs Reviewed  COMPREHENSIVE METABOLIC PANEL - Abnormal; Notable for the following:       Result Value   Potassium 3.1 (*)    Chloride 99 (*)    Glucose, Bld 143 (*)    BUN 22 (*)    Total Protein 8.7 (*)    All other components within normal limits  GASTROINTESTINAL PANEL BY PCR, STOOL (REPLACES STOOL CULTURE)  C DIFFICILE QUICK SCREEN W PCR REFLEX  LIPASE, BLOOD  CBC  URINALYSIS, COMPLETE (UACMP) WITH MICROSCOPIC   ____________________________________________  EKG  EKG was read and interpreted by me shows sinus tach at 116 normal axis no acute ST-T wave changes ____________________________________________  RADIOLOGY  No results found.  ____________________________________________   PROCEDURES  Procedure(s) performed:   Procedures  Critical Care performed:   ____________________________________________   INITIAL IMPRESSION / ASSESSMENT AND PLAN / ED COURSE  Pertinent labs & imaging results that were available during my care of the patient were reviewed by  me and considered in my medical decision making (see chart for details).        ____________________________________________   FINAL CLINICAL IMPRESSION(S) / ED DIAGNOSES  Final diagnoses:  Abdominal pain, unspecified abdominal location  Diarrhea, unspecified type      NEW MEDICATIONS STARTED DURING THIS VISIT:  New Prescriptions   No medications on file     Note:  This document was prepared using Dragon voice recognition software and may include unintentional dictation errors.    Arnaldo Natal, MD 01/13/17 1754

## 2017-01-13 NOTE — ED Notes (Signed)
Pt states he is feeling better, still has not defecated. States he has kids at home he needs to get home to. Dr. Darnelle Catalan informed.

## 2017-02-20 ENCOUNTER — Other Ambulatory Visit: Payer: Self-pay

## 2017-02-20 ENCOUNTER — Encounter: Payer: Self-pay | Admitting: Emergency Medicine

## 2017-02-20 ENCOUNTER — Emergency Department
Admission: EM | Admit: 2017-02-20 | Discharge: 2017-02-20 | Disposition: A | Discharge status: Home / Self Care | Payer: Self-pay | Attending: Emergency Medicine | Admitting: Emergency Medicine

## 2017-02-20 DIAGNOSIS — K029 Dental caries, unspecified: Secondary | ICD-10-CM | POA: Insufficient documentation

## 2017-02-20 DIAGNOSIS — F17210 Nicotine dependence, cigarettes, uncomplicated: Secondary | ICD-10-CM | POA: Insufficient documentation

## 2017-02-20 DIAGNOSIS — K047 Periapical abscess without sinus: Secondary | ICD-10-CM | POA: Insufficient documentation

## 2017-02-20 MED ORDER — PENICILLIN V POTASSIUM 500 MG PO TABS: 500.0000 mg | ORAL_TABLET | Freq: Four times a day (QID) | ORAL | 0 refills | Status: DC

## 2017-02-20 MED ORDER — PENICILLIN V POTASSIUM 500 MG PO TABS
500.0000 mg | ORAL_TABLET | Freq: Once | ORAL | Status: AC
  Administered 2017-02-20: 500 mg via ORAL
  Filled 2017-02-20: qty 1

## 2017-02-20 NOTE — Discharge Instructions (Signed)
Take the antibiotic as directed. Follow-up with one of the dental clinics as needed.   OPTIONS FOR DENTAL FOLLOW UP CARE  [place] Department of Health and Human Services - Local Safety Net Dental Clinics [email or web address]://www.ncdhhs.gov/dph/oralhealth/services/safetynetclinics.htm   [facility] [phone])  [facility] [phone])  [facility] [phone]  [facility] [phone])  [facility] [phone]) This clinic caters to the indigent population and is on a lottery system. Location: [employer] [address], [address], [place] [facility] Hours: Wednesdays from 6pm - 9pm, patients seen by a lottery system. For dates, call or go to [email or web address] Services: Cleanings, fillings and simple extractions. Payment Options: DENTAL WORK IS FREE OF CHARGE. Bring proof of income or support. Best way to get seen: Arrive at 5:15 pm - this is a lottery, NOT first come/first serve, so arriving earlier will not increase your chances of being seen.     [facility] [phone] Select option 1 for emergencies   Location: [facility] [address], [address], [place] [facility] Hours: No walk-ins accepted - call the day before to schedule an appointment. Check in times are 9:30 am and 1:30 pm. Services: Simple extractions, temporary fillings, pulpectomy/pulp debridement, uncomplicated abscess drainage. Payment Options: PAYMENT IS DUE AT THE TIME OF SERVICE.  Fee is usually $100-200, additional surgical procedures (e.g. abscess drainage) may be extra. Cash, checks, Visa/MasterCard accepted.  Can file Medicaid if patient is covered for dental - patient should call case worker to check. No discount for [facility] patients. Best way to get seen: MUST call the day before and get onto the schedule. Can usually be seen the next 1-2  days. No walk-ins accepted.     [facility] [phone]   Location: [facility], [address], [place] [facility] Hours: M, W, Th, F 8am or 1:30pm, Tues 9a or 1:30 - first come/first served. Services: Simple extractions, temporary fillings, uncomplicated abscess drainage.  You do not need to be an [place] resident. Payment Options: PAYMENT IS DUE AT THE TIME OF SERVICE. Dental insurance, otherwise sliding scale - bring proof of income or support. Depending on income and treatment needed, cost is usually $50-200. Best way to get seen: Arrive early as it is first come/first served.     [facility] [phone]   Location: [address] Clinic Hours: Mon-Thu 8a-5p Services: Most basic dental services including extractions and fillings. Payment Options: PAYMENT IS DUE AT THE TIME OF SERVICE. Sliding scale, up to 50% off - bring proof if income or support. Medicaid with dental option accepted. Best way to get seen: Call to schedule an appointment, can usually be seen within 2 weeks OR they will try to see walk-ins - show up at 8a or 2p (you may have to wait).     [facility] [phone] [place] RESIDENTS ONLY   Location: [facility], [address], [place], [place] [id number] Clinic Hours: By appointment only. Monday - Thursday 8am-5pm, Friday 8am-12pm Services: Cleanings, fillings, extractions. Payment Options: PAYMENT IS DUE AT THE TIME OF SERVICE. Cash, Visa or MasterCard. Sliding scale - $30 minimum per service. Best way to get seen: Come in to office, complete packet and make an appointment - need proof of income or support monies for each household member and proof of [place] residence. Usually takes about a month to get in.  [facility] [phone]   Location: [address],  [place] Clinic Hours: Walk-in Urgent Care Dental Services are offered Monday-Friday mornings only. The numbers of emergencies accepted daily is limited to the number of providers available. Maximum 15 - Mondays, Wednesdays & Thursdays Maximum 10 - Tuesdays & Fridays Services: You do not need to be a [facility] resident to be seen for a dental emergency. Emergencies are defined as pain, swelling, abnormal bleeding, or dental trauma. Walkins will receive x-rays if needed. NOTE: Dental cleaning is not an emergency. Payment Options: PAYMENT IS DUE AT THE TIME OF SERVICE. Minimum co-pay is $40.00 for uninsured patients. Minimum co-pay is $3.00 for Medicaid with dental coverage. Dental Insurance is accepted and must be presented at time of visit. Medicare does not cover dental. Forms of payment: Cash, credit card, checks. Best way to get seen: If not previously registered with the clinic, walk-in dental registration begins at 7:15 am and is on a first come/first serve basis. If previously registered with the clinic, call to make an appointment.     [facility] [place] ONLY   Location: [address], [place], [place] Clinic Hours: Mon-Thu 10a-2p Services: Extractions only! Payment Options: FREE (donations accepted) - bring proof of income or support Best way to get seen: Call and schedule an appointment OR come at 8am on the 1st Monday of every month (except for holidays) when it is first come/first served.     [facility] [phone]   Location: [place], [place] Clinic Hours: Friday mornings Services, Payment Options, Best way to get seen: Call for info

## 2017-02-20 NOTE — ED Provider Notes (Signed)
[facility] Emergency Department Provider Note ____________________________________________  Time seen: 2233  I have reviewed the triage vital signs and the nursing notes.  HISTORY  Chief Complaint  Dental Pain  HPI Bryan Bush is a 31 y.o. male presents to the ED for evaluation of left lower jaw dental pain.  Patient with a history of dental abscesses and admittedly poor dentition, presents to the ED for evaluation and management of a dental infection.  Denies any fevers, chills, sweats.  Also denies any spontaneous purulent drainage.  He does admit to being seen by a local dental clinic, but being referred to the oral surgeon, his out-of-pocket cost will be $1000 for dental extraction.  History reviewed. No pertinent past medical history.  There are no active problems to display for this patient.  History reviewed. No pertinent surgical history.  Prior to Admission medications   Medication Sig Start Date End Date Taking? Authorizing Provider  amoxicillin (AMOXIL) 500 MG capsule Take 1 capsule (500 mg total) by mouth 3 (three) times daily. Patient not taking: Reported on 01/13/2017 07/19/16   Joni ReiningSmith, Ronald K, PA-C  amoxicillin-clavulanate (AUGMENTIN) 875-125 MG tablet Take 1 tablet by mouth 2 (two) times daily. Patient not taking: Reported on 01/13/2017 07/22/16   Irean HongSung, Jade J, MD  dexamethasone (DECADRON) 2 MG tablet Take 6 tablets on Day 1 with food, then decrease by 1 tablet daily until finished (6,5,4,3,2,1) Patient not taking: Reported on 06/13/2016 11/15/15   Hagler, Jami L, PA-C  diflunisal (DOLOBID) 500 MG TABS tablet Take 1 tablet (500 mg total) by mouth 2 (two) times daily. Patient not taking: Reported on 06/13/2016 03/14/15   Cuthriell, Delorise RoyalsJonathan D, PA-C  lidocaine (XYLOCAINE) 2 % solution Use as directed 20 mLs in the mouth or throat as needed for mouth pain. Patient not taking: Reported on 01/13/2017 08/09/16   Rebecka ApleyWebster, Allison P, MD   oxyCODONE-acetaminophen (ROXICET) 5-325 MG tablet Take 1 tablet by mouth every 4 (four) hours as needed for severe pain. Patient not taking: Reported on 01/13/2017 07/22/16   Irean HongSung, Jade J, MD  penicillin v potassium (VEETID) 500 MG tablet Take 1 tablet (500 mg total) 4 (four) times daily by mouth. 02/20/17   Lisamarie Coke, Charlesetta IvoryJenise V Bacon, PA-C  traMADol (ULTRAM) 50 MG tablet Take 1 tablet (50 mg total) by mouth every 6 (six) hours as needed. Patient not taking: Reported on 01/13/2017 06/13/16   Minna AntisPaduchowski, Kevin, MD  traMADol (ULTRAM) 50 MG tablet Take 1 tablet (50 mg total) by mouth every 6 (six) hours as needed for moderate pain. Patient not taking: Reported on 01/13/2017 07/19/16   Joni ReiningSmith, Ronald K, PA-C    Allergies Patient has no known allergies.  No family history on file.  Social History Social History   Tobacco Use  . Smoking status: Current Every Day Smoker    Packs/day: 0.50    Types: Cigarettes  . Smokeless tobacco: Never Used  Substance Use Topics  . Alcohol use: No  . Drug use: No    Review of Systems  Constitutional: Negative for fever. Eyes: Negative for visual changes. ENT: Negative for sore throat.  Dental infection as above. Cardiovascular: Negative for chest pain. Respiratory: Negative for shortness of breath. Gastrointestinal: Negative for abdominal pain, vomiting and diarrhea. Neurological: Negative for headaches, focal weakness or numbness. ____________________________________________  PHYSICAL EXAM:  VITAL SIGNS: ED Triage Vitals  Enc Vitals Group     BP 02/20/17 2132 (!) 147/85     Pulse Rate 02/20/17 2132 100  Resp 02/20/17 2132 18     Temp 02/20/17 2132 98.3 F (36.8 C)     Temp Source 02/20/17 2132 Oral     SpO2 02/20/17 2132 100 %     Weight 02/20/17 2133 170 lb (77.1 kg)     Height 02/20/17 2133 5\' 5"  (1.651 m)     Head Circumference --      Peak Flow --      Pain Score 02/20/17 2132 4     Pain Loc --      Pain Edu? --      Excl. in GC? --      Constitutional: Alert and oriented. Well appearing and in no distress. Head: Normocephalic and atraumatic. Eyes: Conjunctivae are normal. PERRL. Normal extraocular movements Ears: Canals clear. TMs intact bilaterally. Nose: No congestion/rhinorrhea/epistaxis. Mouth/Throat: Mucous membranes are moist.  He will is midline and tonsils are flat.  No oropharyngeal lesions are appreciated.  Patient is tender to palpation to the lower anterior jaw at the premolar.  There is poor dentition noted generally.  He has several decayed teeth down to the gumline.  No focal abscess, pointing, fluctuance, or spontaneous drainage is appreciated in the area of concern.  No brawny, sublingual edema is noted. Neck: Supple. No thyromegaly. Hematological/Lymphatic/Immunological: No cervical lymphadenopathy. Cardiovascular: Normal rate, regular rhythm. Normal distal pulses. Respiratory: Normal respiratory effort. No wheezes/rales/rhonchi. ___________________________________________  PROCEDURES  Pen VK 500 mg PO ____________________________________________  INITIAL IMPRESSION / ASSESSMENT AND PLAN / ED COURSE  ED evaluation of dental pain and acute dental infection secondary to caries.  He is discharged with a prescription for Pen-Vee K, as well as #10 Ultram for breakthrough pain.  He is referred to the local dental clinics for definitive management.  Return precautions are reviewed. ____________________________________________  FINAL CLINICAL IMPRESSION(S) / ED DIAGNOSES  Final diagnoses:  Pain due to dental caries  Infected dental caries      Lissa Hoard, PA-C 02/20/17 2339    Merrily Brittle, MD 02/23/17 2254

## 2017-02-20 NOTE — ED Triage Notes (Signed)
Patient to ER for c/o left lower dental pain. States he has h/o dental abscess, feels the same as previous. Denies fevers.

## 2017-02-20 NOTE — ED Notes (Signed)
Patient states he came here for antibiotics for dental pain. Patient states he uses Tylenol and Ibuprofen at home for pain and has good relief.

## 2017-07-03 ENCOUNTER — Encounter: Payer: Self-pay | Admitting: Emergency Medicine

## 2017-07-03 ENCOUNTER — Other Ambulatory Visit: Payer: Self-pay

## 2017-07-03 DIAGNOSIS — R079 Chest pain, unspecified: Secondary | ICD-10-CM | POA: Insufficient documentation

## 2017-07-03 DIAGNOSIS — K0889 Other specified disorders of teeth and supporting structures: Secondary | ICD-10-CM | POA: Insufficient documentation

## 2017-07-03 DIAGNOSIS — F17210 Nicotine dependence, cigarettes, uncomplicated: Secondary | ICD-10-CM | POA: Insufficient documentation

## 2017-07-03 DIAGNOSIS — K029 Dental caries, unspecified: Secondary | ICD-10-CM | POA: Insufficient documentation

## 2017-07-03 NOTE — ED Triage Notes (Addendum)
Patient ambulatory to triage with steady gait, without difficulty or distress noted; pt reports right sided CP radiating into back tonight that increases with movement; denies hx of same; pt also c/o dental pain; has been seen multiple times for same

## 2017-07-04 ENCOUNTER — Emergency Department
Admission: EM | Admit: 2017-07-04 | Discharge: 2017-07-04 | Disposition: A | Discharge status: Home / Self Care | Payer: Self-pay | Attending: Emergency Medicine | Admitting: Emergency Medicine

## 2017-07-04 ENCOUNTER — Emergency Department: Payer: Self-pay

## 2017-07-04 DIAGNOSIS — R079 Chest pain, unspecified: Secondary | ICD-10-CM

## 2017-07-04 DIAGNOSIS — K0889 Other specified disorders of teeth and supporting structures: Secondary | ICD-10-CM

## 2017-07-04 DIAGNOSIS — K029 Dental caries, unspecified: Secondary | ICD-10-CM

## 2017-07-04 LAB — BASIC METABOLIC PANEL
Anion gap: 8 (ref 5–15)
BUN: 8 mg/dL (ref 6–20)
CO2: 26 mmol/L (ref 22–32)
Calcium: 8.7 mg/dL — ABNORMAL LOW (ref 8.9–10.3)
Chloride: 99 mmol/L — ABNORMAL LOW (ref 101–111)
Creatinine, Ser: 0.86 mg/dL (ref 0.61–1.24)
GFR calc Af Amer: >60 mL/min (ref >60)
GLUCOSE: 105 mg/dL — AB (ref 65–99)
POTASSIUM: 3 mmol/L — AB (ref 3.5–5.1)
Sodium: 133 mmol/L — ABNORMAL LOW (ref 135–145)

## 2017-07-04 LAB — CBC
HEMATOCRIT: 41.1 % (ref 40.0–52.0)
Hemoglobin: 14 g/dL (ref 13.0–18.0)
MCH: 31.4 pg (ref 26.0–34.0)
MCHC: 34.2 g/dL (ref 32.0–36.0)
MCV: 91.8 fL (ref 80.0–100.0)
Platelets: 264 10*3/uL (ref 150–440)
RBC: 4.47 MIL/uL (ref 4.40–5.90)
RDW: 13.1 % (ref 11.5–14.5)
WBC: 7.5 10*3/uL (ref 3.8–10.6)

## 2017-07-04 LAB — TROPONIN I
Troponin I: <0.03 ng/mL (ref <0.03)
Troponin I: <0.03 ng/mL (ref <0.03)

## 2017-07-04 MED ORDER — CEPHALEXIN 500 MG PO CAPS
500.0000 mg | ORAL_CAPSULE | Freq: Once | ORAL | Status: AC
  Administered 2017-07-04: 500 mg via ORAL
  Filled 2017-07-04: qty 1

## 2017-07-04 MED ORDER — CEPHALEXIN 500 MG PO CAPS: 500.0000 mg | ORAL_CAPSULE | Freq: Four times a day (QID) | ORAL | 0 refills | Status: AC

## 2017-07-04 MED ORDER — TRAMADOL HCL 50 MG PO TABS: 50.0000 mg | ORAL_TABLET | Freq: Four times a day (QID) | ORAL | 0 refills | Status: DC | PRN

## 2017-07-04 MED ORDER — POTASSIUM CHLORIDE CRYS ER 20 MEQ PO TBCR
40.0000 meq | EXTENDED_RELEASE_TABLET | Freq: Once | ORAL | Status: AC
  Administered 2017-07-04: 40 meq via ORAL
  Filled 2017-07-04: qty 2

## 2017-07-04 NOTE — Discharge Instructions (Signed)
Please follow up with your primary care physician as well as your dentist for further evaluation and treatment of your symptoms.

## 2017-07-04 NOTE — ED Notes (Signed)
Chest pain radiating around to back x 15 min earlier and dental pain

## 2017-07-04 NOTE — ED Provider Notes (Signed)
[facility] Emergency Department Provider Note   ____________________________________________   First MD Initiated Contact with Patient 07/04/17 [phone]     (approximate)  I have reviewed the triage vital signs and the nursing notes.   HISTORY  Chief Complaint Chest Pain and Dental Pain    HPI Bryan Bush is a 32 y.o. male who comes into the hospital today with some right-sided chest pain and pain in his back.  The patient reports that the pain lasted about 10-15 minutes but it is gone at this point.  He also has some infection in his teeth.  The patient reports that the pain was sharp but he did not have any shortness of breath.  He was unsure exactly what started the pain so he decided to come and get checked out.  He was sitting down when it started.  The patient denies any nausea, vomiting, lightheadedness or dizziness.  The patient also reports he has some dental infection.  The patient has many rotten teeth and reports that his gums are swollen and painful.  He states that he has an appointment to see a dentist at the end of April but he did not think he can wait that long.  His gums were swollen as well.  History reviewed. No pertinent past medical history.  There are no active problems to display for this patient.   History reviewed. No pertinent surgical history.  Prior to Admission medications   Medication Sig Start Date End Date Taking? Authorizing Provider  amoxicillin (AMOXIL) 500 MG capsule Take 1 capsule (500 mg total) by mouth 3 (three) times daily. Patient not taking: Reported on 01/13/2017 07/19/16   Joni ReiningSmith, Ronald K, PA-C  amoxicillin-clavulanate (AUGMENTIN) 875-125 MG tablet Take 1 tablet by mouth 2 (two) times daily. Patient not taking: Reported on 01/13/2017 07/22/16   Irean HongSung, Jade J, MD  cephALEXin (KEFLEX) 500 MG capsule Take 1 capsule (500 mg total) by mouth 4 (four) times daily for 10 days. 07/04/17 07/14/17  Rebecka ApleyWebster, Demond Shallenberger P, MD    dexamethasone (DECADRON) 2 MG tablet Take 6 tablets on Day 1 with food, then decrease by 1 tablet daily until finished (6,5,4,3,2,1) Patient not taking: Reported on 06/13/2016 11/15/15   Hagler, Jami L, PA-C  diflunisal (DOLOBID) 500 MG TABS tablet Take 1 tablet (500 mg total) by mouth 2 (two) times daily. Patient not taking: Reported on 06/13/2016 03/14/15   Cuthriell, Delorise RoyalsJonathan D, PA-C  lidocaine (XYLOCAINE) 2 % solution Use as directed 20 mLs in the mouth or throat as needed for mouth pain. Patient not taking: Reported on 01/13/2017 08/09/16   Rebecka ApleyWebster, Domenick Quebedeaux P, MD  oxyCODONE-acetaminophen (ROXICET) 5-325 MG tablet Take 1 tablet by mouth every 4 (four) hours as needed for severe pain. Patient not taking: Reported on 01/13/2017 07/22/16   Irean HongSung, Jade J, MD  penicillin v potassium (VEETID) 500 MG tablet Take 1 tablet (500 mg total) 4 (four) times daily by mouth. 02/20/17   Menshew, Charlesetta IvoryJenise V Bacon, PA-C  traMADol (ULTRAM) 50 MG tablet Take 1 tablet (50 mg total) by mouth every 6 (six) hours as needed. Patient not taking: Reported on 01/13/2017 06/13/16   Minna AntisPaduchowski, Kevin, MD  traMADol (ULTRAM) 50 MG tablet Take 1 tablet (50 mg total) by mouth every 6 (six) hours as needed for moderate pain. Patient not taking: Reported on 01/13/2017 07/19/16   Joni ReiningSmith, Ronald K, PA-C  traMADol (ULTRAM) 50 MG tablet Take 1 tablet (50 mg total) by mouth every 6 (six) hours as  needed. 07/04/17   Rebecka Apley, MD    Allergies Patient has no known allergies.  No family history on file.  Social History Social History   Tobacco Use  . Smoking status: Current Every Day Smoker    Packs/day: 0.50    Types: Cigarettes  . Smokeless tobacco: Never Used  Substance Use Topics  . Alcohol use: No  . Drug use: No    Review of Systems  Constitutional: No fever/chills Eyes: No visual changes. ENT: Multiple dental caries Cardiovascular:  chest pain. Respiratory: Denies shortness of breath. Gastrointestinal: No abdominal  pain.  No nausea, no vomiting.  No diarrhea.  No constipation. Genitourinary: Negative for dysuria. Musculoskeletal: Negative for back pain. Skin: Negative for rash. Neurological: Negative for headaches, focal weakness or numbness.   ____________________________________________   PHYSICAL EXAM:  VITAL SIGNS: ED Triage Vitals [07/03/17 2359]  Enc Vitals Group     BP 130/79     Pulse Rate 70     Resp 18     Temp 98.3 F (36.8 C)     Temp Source Oral     SpO2 100 %     Weight 170 lb (77.1 kg)     Height 5\' 5"  (1.651 m)     Head Circumference      Peak Flow      Pain Score 1     Pain Loc      Pain Edu?      Excl. in GC?     Constitutional: Alert and oriented. Well appearing and in mild distress. Eyes: Conjunctivae are normal. PERRL. EOMI. Head: Atraumatic. Nose: No congestion/rhinnorhea. Mouth/Throat: Mucous membranes are moist.  Oropharynx non-erythematous.  Poor dentition Cardiovascular: Normal rate, regular rhythm. Grossly normal heart sounds.  Good peripheral circulation. Respiratory: Normal respiratory effort.  No retractions. Lungs CTAB. Gastrointestinal: Soft and nontender. No distention.  Positive bowel sounds Musculoskeletal: No lower extremity tenderness nor edema.   Neurologic:  Normal speech and language.  Skin:  Skin is warm, dry and intact.  Psychiatric: Mood and affect are normal.   ____________________________________________   LABS (all labs ordered are listed, but only abnormal results are displayed)  Labs Reviewed  BASIC METABOLIC PANEL - Abnormal; Notable for the following components:      Result Value   Sodium 133 (*)    Potassium 3.0 (*)    Chloride 99 (*)    Glucose, Bld 105 (*)    Calcium 8.7 (*)    All other components within normal limits  CBC  TROPONIN I  TROPONIN I   ____________________________________________  EKG  ED ECG REPORT I, Rebecka Apley, the attending physician, personally viewed and interpreted this ECG.    Date: 07/04/2017  EKG Time: 0002  Rate: 83  Rhythm: normal sinus rhythm  Axis: normal  Intervals:none  ST&T Change: none  ____________________________________________  RADIOLOGY  ED MD interpretation:  CXR: No acute disease this is a 32 year old male who comes into the hospital today with some chest pain and some dental pain.  The patient has multiple rotted teeth in very poor dentition.  I will treat the patient with some Keflex  Official radiology report(s): Dg Chest 2 View  Result Date: 07/04/2017 CLINICAL DATA:  Right-sided chest pain. EXAM: CHEST - 2 VIEW COMPARISON:  Radiograph 03/21/2007 FINDINGS: The cardiomediastinal contours are normal. The lungs are clear. Pulmonary vasculature is normal. No consolidation, pleural effusion, or pneumothorax. No acute osseous abnormalities are seen. IMPRESSION: Unremarkable radiographs of the chest. Electronically  Signed   By: Rubye Oaks M.D.   On: 07/04/2017 00:36    ____________________________________________   PROCEDURES  Procedure(s) performed: None  Procedures  Critical Care performed: No  ____________________________________________   INITIAL IMPRESSION / ASSESSMENT AND PLAN / ED COURSE  As part of my medical decision making, I reviewed the following data within the electronic MEDICAL RECORD NUMBER Notes from prior ED visits and [place] Controlled Substance Database   This is a 32 year old male who comes into the hospital today with some chest pain and dental pain.  My differential diagnosis includes musculoskeletal pain, acute coronary syndrome  We did check some blood work to include a CBC BMP and troponin on the patient.  The patient was found to have some low potassium so I did give the patient 44 M EQ's of KCl.  The patient also complained of some dental pain and has extremely poor dentition.  I did give the patient a dose of Keflex.  I encouraged the patient to follow-up with a dentist.  He reports that the infections  tend to come frequently and I explained that if he continues to have these dental cavities that the infections will continue to come.  The patient had a repeat troponin which was negative and a chest x-ray which was normal.  The patient is not in any pain at this time.  He will be discharged home to follow-up with his primary care physician as well as his dentist.  The patient has no further complaints.      ____________________________________________   FINAL CLINICAL IMPRESSION(S) / ED DIAGNOSES  Final diagnoses:  Chest pain, unspecified type  Dental caries  Pain, dental     ED Discharge Orders        Ordered    cephALEXin (KEFLEX) 500 MG capsule  4 times daily     07/04/17 0611    traMADol (ULTRAM) 50 MG tablet  Every 6 hours PRN     07/04/17 1610       Note:  This document was prepared using Dragon voice recognition software and may include unintentional dictation errors.    Rebecka Apley, MD 07/04/17 [phone]

## 2017-09-14 ENCOUNTER — Emergency Department: Payer: Self-pay

## 2017-09-14 ENCOUNTER — Emergency Department
Admission: EM | Admit: 2017-09-14 | Discharge: 2017-09-14 | Disposition: A | Discharge status: Home / Self Care | Payer: Self-pay | Attending: Emergency Medicine | Admitting: Emergency Medicine

## 2017-09-14 ENCOUNTER — Other Ambulatory Visit: Payer: Self-pay

## 2017-09-14 DIAGNOSIS — F17210 Nicotine dependence, cigarettes, uncomplicated: Secondary | ICD-10-CM | POA: Insufficient documentation

## 2017-09-14 DIAGNOSIS — M79609 Pain in unspecified limb: Secondary | ICD-10-CM

## 2017-09-14 DIAGNOSIS — R202 Paresthesia of skin: Secondary | ICD-10-CM | POA: Insufficient documentation

## 2017-09-14 DIAGNOSIS — M25512 Pain in left shoulder: Secondary | ICD-10-CM | POA: Insufficient documentation

## 2017-09-14 MED ORDER — METHYLPREDNISOLONE 4 MG PO TBPK: ORAL_TABLET | ORAL | 0 refills | Status: DC

## 2017-09-14 MED ORDER — CYCLOBENZAPRINE HCL 10 MG PO TABS: 10.0000 mg | ORAL_TABLET | Freq: Three times a day (TID) | ORAL | 0 refills | Status: DC | PRN

## 2017-09-14 NOTE — ED Provider Notes (Signed)
[facility] Emergency Department Provider Note   ____________________________________________   First MD Initiated Contact with Patient 09/14/17 [phone]     (approximate)  I have reviewed the triage vital signs and the nursing notes.   HISTORY  Chief Complaint Tingling    HPI Dellas Guard is a 32 y.o. male patient complain of left shoulder pain for 2 months.  Patient states became more concerned when he noticed tingling in his left wrist yesterday.  Patient denies headache, vision disturbance, facial weakness comes or vertigo.  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  Patient is right-hand dominant.  No palates measured for complaint.  History reviewed. No pertinent past medical history.  There are no active problems to display for this patient.   History reviewed. No pertinent surgical history.  Prior to Admission medications   Medication Sig Start Date End Date Taking? Authorizing Provider  amoxicillin (AMOXIL) 500 MG capsule Take 1 capsule (500 mg total) by mouth 3 (three) times daily. Patient not taking: Reported on 01/13/2017 07/19/16   Joni Reining, PA-C  amoxicillin-clavulanate (AUGMENTIN) 875-125 MG tablet Take 1 tablet by mouth 2 (two) times daily. Patient not taking: Reported on 01/13/2017 07/22/16   Irean Hong, MD  cyclobenzaprine (FLEXERIL) 10 MG tablet Take 1 tablet (10 mg total) by mouth 3 (three) times daily as needed. 09/14/17   Joni Reining, PA-C  dexamethasone (DECADRON) 2 MG tablet Take 6 tablets on Day 1 with food, then decrease by 1 tablet daily until finished (6,5,4,3,2,1) Patient not taking: Reported on 06/13/2016 11/15/15   Hagler, Jami L, PA-C  diflunisal (DOLOBID) 500 MG TABS tablet Take 1 tablet (500 mg total) by mouth 2 (two) times daily. Patient not taking: Reported on 06/13/2016 03/14/15   Cuthriell, Delorise Royals, PA-C  lidocaine (XYLOCAINE) 2 % solution Use as directed 20 mLs in the mouth or throat as needed  for mouth pain. Patient not taking: Reported on 01/13/2017 08/09/16   Rebecka Apley, MD  methylPREDNISolone (MEDROL DOSEPAK) 4 MG TBPK tablet Take Tapered dose as directed 09/14/17   Joni Reining, PA-C  oxyCODONE-acetaminophen (ROXICET) 5-325 MG tablet Take 1 tablet by mouth every 4 (four) hours as needed for severe pain. Patient not taking: Reported on 01/13/2017 07/22/16   Irean Hong, MD  penicillin v potassium (VEETID) 500 MG tablet Take 1 tablet (500 mg total) 4 (four) times daily by mouth. 02/20/17   Menshew, Charlesetta Ivory, PA-C  traMADol (ULTRAM) 50 MG tablet Take 1 tablet (50 mg total) by mouth every 6 (six) hours as needed. Patient not taking: Reported on 01/13/2017 06/13/16   Minna Antis, MD  traMADol (ULTRAM) 50 MG tablet Take 1 tablet (50 mg total) by mouth every 6 (six) hours as needed for moderate pain. Patient not taking: Reported on 01/13/2017 07/19/16   Joni Reining, PA-C  traMADol (ULTRAM) 50 MG tablet Take 1 tablet (50 mg total) by mouth every 6 (six) hours as needed. 07/04/17   Rebecka Apley, MD    Allergies Patient has no known allergies.  No family history on file.  Social History Social History   Tobacco Use  . Smoking status: Current Every Day Smoker    Packs/day: 0.50    Types: Cigarettes  . Smokeless tobacco: Never Used  Substance Use Topics  . Alcohol use: No  . Drug use: No    Review of Systems Constitutional: No fever/chills Eyes: No visual changes. ENT: No sore throat. Cardiovascular:  Denies chest pain. Respiratory: Denies shortness of breath. Gastrointestinal: No abdominal pain.  No nausea, no vomiting.  No diarrhea.  No constipation. Genitourinary: Negative for dysuria. Musculoskeletal: Positive for left shoulder pain. Skin: Negative for rash. Neurological: Negative for headaches.  Numbness to left forearm and wrist..   ____________________________________________   PHYSICAL EXAM:  VITAL SIGNS: ED Triage Vitals  Enc Vitals  Group     BP 09/14/17 0702 121/75     Pulse Rate 09/14/17 0702 92     Resp 09/14/17 0702 16     Temp 09/14/17 0702 98 F (36.7 C)     Temp Source 09/14/17 0702 Oral     SpO2 09/14/17 0702 99 %     Weight 09/14/17 0703 170 lb (77.1 kg)     Height 09/14/17 0703  (1.651 m)     Head Circumference --      Peak Flow --      Pain Score 09/14/17 0703 5     Pain Loc --      Pain Edu? --      Excl. in GC? --    Constitutional: Alert and oriented. Well appearing and in no acute distress. Neck: No stridor.  Cardiovascular: Normal rate, regular rhythm. Grossly normal heart sounds.  Good peripheral circulation. Respiratory: Normal respiratory effort.  No retractions. Lungs CTAB. Musculoskeletal: No obvious deformity to the left upper extremity.  Patient full neck range of motion.  Strength against resistance 5/5 and compared to the right upper extremity.. Neurologic:  Normal speech and language. No gross focal neurologic deficits are appreciated. No gait instability. Skin:  Skin is warm, dry and intact. No rash noted. Psychiatric: Mood and affect are normal. Speech and behavior are normal.  ____________________________________________   LABS (all labs ordered are listed, but only abnormal results are displayed)  Labs Reviewed - No data to display ____________________________________________  EKG   ____________________________________________  RADIOLOGY  ED MD interpretation:    Official radiology report(s): Dg Shoulder Left  Result Date: 09/14/2017 CLINICAL DATA:  Patient with history of left shoulder pain. EXAM: LEFT SHOULDER - 2+ VIEW COMPARISON:  None. FINDINGS: Normal anatomic alignment. No evidence for acute fracture or dislocation. Visualized left hemithorax is unremarkable. IMPRESSION: No acute osseous abnormality. Electronically Signed   By: Annia Belt M.D.   On: 09/14/2017 08:18    ____________________________________________   PROCEDURES  Procedure(s)  performed: None  Procedures  Critical Care performed: No  ____________________________________________   INITIAL IMPRESSION / ASSESSMENT AND PLAN / ED COURSE  As part of my medical decision making, I reviewed the following data within the electronic MEDICAL RECORD NUMBER    Paraesthesia left upper extremity.  Discussed negative x-ray findings with patient.  Patient given discharge care instruction advised take medication as directed.  Patient advised follow-up with PCP if no improvement in 3 to 5 days.      ____________________________________________   FINAL CLINICAL IMPRESSION(S) / ED DIAGNOSES  Final diagnoses:  Paresthesia and pain of left extremity     ED Discharge Orders        Ordered    methylPREDNISolone (MEDROL DOSEPAK) 4 MG TBPK tablet     09/14/17 0827    cyclobenzaprine (FLEXERIL) 10 MG tablet  3 times daily PRN     09/14/17 0827       Note:  This document was prepared using Dragon voice recognition software and may include unintentional dictation errors.    Joni Reining, PA-C 09/14/17 0830    Scotty Court,  Aneta Mins, MD 09/14/17 1530

## 2017-09-14 NOTE — ED Triage Notes (Signed)
Pt states that his left arm is tingling into his wrist - this started yesterday - denies headache or dizziness - pt states that he has "problems" with shoulder that have been present for 2 months

## 2017-09-14 NOTE — ED Notes (Addendum)
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

## 2018-02-21 ENCOUNTER — Other Ambulatory Visit: Payer: Self-pay

## 2018-02-21 ENCOUNTER — Emergency Department
Admission: EM | Admit: 2018-02-21 | Discharge: 2018-02-21 | Disposition: A | Discharge status: Home / Self Care | Payer: Self-pay | Attending: Emergency Medicine | Admitting: Emergency Medicine

## 2018-02-21 DIAGNOSIS — F17210 Nicotine dependence, cigarettes, uncomplicated: Secondary | ICD-10-CM | POA: Insufficient documentation

## 2018-02-21 DIAGNOSIS — J069 Acute upper respiratory infection, unspecified: Secondary | ICD-10-CM | POA: Insufficient documentation

## 2018-02-21 DIAGNOSIS — Z79899 Other long term (current) drug therapy: Secondary | ICD-10-CM | POA: Insufficient documentation

## 2018-02-21 LAB — GROUP A STREP BY PCR: Group A Strep by PCR: NEGATIVE — AB

## 2018-02-21 MED ORDER — FLUTICASONE PROPIONATE 50 MCG/ACT NA SUSP: 1.0000 | Freq: Every day | NASAL | 2 refills | Status: DC

## 2018-02-21 MED ORDER — BENZONATATE 100 MG PO CAPS: 100.0000 mg | ORAL_CAPSULE | Freq: Three times a day (TID) | ORAL | 0 refills | Status: AC | PRN

## 2018-02-21 NOTE — ED Triage Notes (Signed)
Pt stated that he has been feeling bad since last night and it got worse today. Pt c/o sore throat, body aches and cough. Denies any fever, v/d but did feel nauseated at work earlier today.

## 2018-02-21 NOTE — ED Provider Notes (Signed)
[facility] Emergency Department Provider Note  ____________________________________________  Time seen: Approximately 10:39 PM  I have reviewed the triage vital signs and the nursing notes.   HISTORY  Chief Complaint Sore Throat    HPI Bryan Bush is a 32 y.o. male presents to the emergency department with rhinorrhea, congestion, nonproductive cough and myalgias for the past 2 days.  Patient reports that his son was diagnosed with strep pharyngitis a week ago and is concerned that he might have strep also.  Patient is tolerating fluids by mouth and his own secretions.  No emesis or diarrhea.  No other changes in stooling or urinary habits. No alleviating measures have been attempted.    No past medical history on file.  There are no active problems to display for this patient.   No past surgical history on file.  Prior to Admission medications   Medication Sig Start Date End Date Taking? Authorizing Provider  amoxicillin (AMOXIL) 500 MG capsule Take 1 capsule (500 mg total) by mouth 3 (three) times daily. Patient not taking: Reported on 01/13/2017 07/19/16   Joni Reining, PA-C  amoxicillin-clavulanate (AUGMENTIN) 875-125 MG tablet Take 1 tablet by mouth 2 (two) times daily. Patient not taking: Reported on 01/13/2017 07/22/16   Irean Hong, MD  benzonatate (TESSALON PERLES) 100 MG capsule Take 1 capsule (100 mg total) by mouth 3 (three) times daily as needed for up to 7 days for cough. 02/21/18 02/28/18  Orvil Feil, PA-C  cyclobenzaprine (FLEXERIL) 10 MG tablet Take 1 tablet (10 mg total) by mouth 3 (three) times daily as needed. 09/14/17   Joni Reining, PA-C  dexamethasone (DECADRON) 2 MG tablet Take 6 tablets on Day 1 with food, then decrease by 1 tablet daily until finished (6,5,4,3,2,1) Patient not taking: Reported on 06/13/2016 11/15/15   Hagler, Jami L, PA-C  diflunisal (DOLOBID) 500 MG TABS tablet Take 1 tablet (500 mg total) by mouth 2 (two)  times daily. Patient not taking: Reported on 06/13/2016 03/14/15   Cuthriell, Delorise Royals, PA-C  fluticasone [facility]) 50 MCG/ACT nasal spray Place 1 spray into both nostrils daily. 02/21/18 02/21/19  Orvil Feil, PA-C  lidocaine (XYLOCAINE) 2 % solution Use as directed 20 mLs in the mouth or throat as needed for mouth pain. Patient not taking: Reported on 01/13/2017 08/09/16   Rebecka Apley, MD  methylPREDNISolone (MEDROL DOSEPAK) 4 MG TBPK tablet Take Tapered dose as directed 09/14/17   Joni Reining, PA-C  oxyCODONE-acetaminophen (ROXICET) 5-325 MG tablet Take 1 tablet by mouth every 4 (four) hours as needed for severe pain. Patient not taking: Reported on 01/13/2017 07/22/16   Irean Hong, MD  penicillin v potassium (VEETID) 500 MG tablet Take 1 tablet (500 mg total) 4 (four) times daily by mouth. 02/20/17   Menshew, Charlesetta Ivory, PA-C  traMADol (ULTRAM) 50 MG tablet Take 1 tablet (50 mg total) by mouth every 6 (six) hours as needed. Patient not taking: Reported on 01/13/2017 06/13/16   Minna Antis, MD  traMADol (ULTRAM) 50 MG tablet Take 1 tablet (50 mg total) by mouth every 6 (six) hours as needed for moderate pain. Patient not taking: Reported on 01/13/2017 07/19/16   Joni Reining, PA-C  traMADol (ULTRAM) 50 MG tablet Take 1 tablet (50 mg total) by mouth every 6 (six) hours as needed. 07/04/17   Rebecka Apley, MD    Allergies Patient has no known allergies.  No family history on file.  Social  History Social History   Tobacco Use  . Smoking status: Current Every Day Smoker    Packs/day: 0.50    Types: Cigarettes  . Smokeless tobacco: Never Used  Substance Use Topics  . Alcohol use: No  . Drug use: No      Review of Systems  Constitutional: Patient has low grade fever. Eyes: No visual changes. No discharge ENT: Patient has congestion.  Cardiovascular: no chest pain. Respiratory: Patient has cough.  Gastrointestinal: No abdominal pain.  No nausea, no  vomiting. Patient had diarrhea.  Genitourinary: Negative for dysuria. No hematuria Musculoskeletal: Patient has myalgias.  Skin: Negative for rash, abrasions, lacerations, ecchymosis. Neurological: Patient has headache, no focal weakness or numbness.    ____________________________________________   PHYSICAL EXAM:  VITAL SIGNS: ED Triage Vitals  Enc Vitals Group     BP 02/21/18 2031 125/83     Pulse Rate 02/21/18 2031 86     Resp 02/21/18 2031 16     Temp 02/21/18 2031 98.9 F (37.2 C)     Temp Source 02/21/18 2031 Oral     SpO2 02/21/18 2031 99 %     Weight 02/21/18 2033 170 lb (77.1 kg)     Height 02/21/18 2033 5\' 5"  (1.651 m)     Head Circumference --      Peak Flow --      Pain Score 02/21/18 2033 3     Pain Loc --      Pain Edu? --      Excl. in GC? --      Constitutional: Alert and oriented. Well appearing and in no acute distress. Eyes: Conjunctivae are normal. PERRL. EOMI. Head: Atraumatic. ENT:      Ears: TMs are injected bilaterally.       Nose: No congestion/rhinnorhea.      Mouth/Throat: Mucous membranes are moist.  Posterior pharynx is erythematous without tonsillar hypertrophy or exudate. Neck: No stridor.  Hematological/Lymphatic/Immunilogical: No cervical lymphadenopathy.  Cardiovascular: Normal rate, regular rhythm. Normal S1 and S2.  Good peripheral circulation. Respiratory: Normal respiratory effort without tachypnea or retractions. Lungs CTAB. Good air entry to the bases with no decreased or absent breath sounds. Gastrointestinal: Bowel sounds 4 quadrants. Soft and nontender to palpation. No guarding or rigidity. No palpable masses. No distention. No CVA tenderness. Musculoskeletal: Full range of motion to all extremities. No gross deformities appreciated. Neurologic:  Normal speech and language. No gross focal neurologic deficits are appreciated.  Skin:  Skin is warm, dry and intact. No rash noted. Psychiatric: Mood and affect are normal. Speech  and behavior are normal. Patient exhibits appropriate insight and judgement.   ____________________________________________   LABS (all labs ordered are listed, but only abnormal results are displayed)  Labs Reviewed  GROUP A STREP BY PCR - Abnormal; Notable for the following components:      Result Value   Group A Strep by PCR NEGATIVE (*)    All other components within normal limits   ____________________________________________  EKG   ____________________________________________  RADIOLOGY  No results found.  ____________________________________________    PROCEDURES  Procedure(s) performed:    Procedures    Medications - No data to display   ____________________________________________   INITIAL IMPRESSION / ASSESSMENT AND PLAN / ED COURSE  Pertinent labs & imaging results that were available during my care of the patient were reviewed by me and considered in my medical decision making (see chart for details).  Review of the [place] CSRS was performed in accordance of the  [facility] prior to dispensing any controlled drugs.      Assessment and Plan:  Viral URI Patient presents to the emergency department with rhinorrhea, congestion, nonproductive cough and myalgias for the past 2 days.  Differential diagnosis includes strep pharyngitis versus unspecified viral URI.  Patient tested negative for group A strep in the emergency department.  Rest and hydration were encouraged.  Patient was advised to follow-up with primary care as needed.  All patient questions were answered.     ____________________________________________  FINAL CLINICAL IMPRESSION(S) / ED DIAGNOSES  Final diagnoses:  Viral URI      NEW MEDICATIONS STARTED DURING THIS VISIT:  ED Discharge Orders         Ordered    benzonatate (TESSALON PERLES) 100 MG capsule  3 times daily PRN     02/21/18 2223    fluticasone (FLONASE) 50 MCG/ACT nasal spray  Daily     02/21/18 2223               This chart was dictated using voice recognition software/Dragon. Despite best efforts to proofread, errors can occur which can change the meaning. Any change was purely unintentional.    Orvil Feil, PA-C 02/21/18 2247    Minna Antis, MD 02/21/18 2259

## 2018-05-22 ENCOUNTER — Emergency Department
Admission: EM | Admit: 2018-05-22 | Discharge: 2018-05-22 | Disposition: A | Discharge status: Home / Self Care | Payer: Self-pay | Attending: Emergency Medicine | Admitting: Emergency Medicine

## 2018-05-22 ENCOUNTER — Other Ambulatory Visit: Payer: Self-pay

## 2018-05-22 ENCOUNTER — Encounter: Payer: Self-pay | Admitting: Emergency Medicine

## 2018-05-22 DIAGNOSIS — Z79899 Other long term (current) drug therapy: Secondary | ICD-10-CM | POA: Insufficient documentation

## 2018-05-22 DIAGNOSIS — K029 Dental caries, unspecified: Secondary | ICD-10-CM | POA: Insufficient documentation

## 2018-05-22 DIAGNOSIS — F17210 Nicotine dependence, cigarettes, uncomplicated: Secondary | ICD-10-CM | POA: Insufficient documentation

## 2018-05-22 MED ORDER — TRAMADOL HCL 50 MG PO TABS: 50.0000 mg | ORAL_TABLET | Freq: Three times a day (TID) | ORAL | 0 refills | Status: AC | PRN

## 2018-05-22 MED ORDER — AMOXICILLIN 500 MG PO CAPS
500.0000 mg | ORAL_CAPSULE | Freq: Once | ORAL | Status: AC
  Administered 2018-05-22: 500 mg via ORAL
  Filled 2018-05-22: qty 1

## 2018-05-22 MED ORDER — AMOXICILLIN 500 MG PO CAPS: 500.0000 mg | ORAL_CAPSULE | Freq: Three times a day (TID) | ORAL | 0 refills | Status: DC

## 2018-05-22 NOTE — Discharge Instructions (Signed)
Take the antibiotic as directed and the pain medicine as needed. Follow-up with one of the local dental clinics for dental extraction.   OPTIONS FOR DENTAL FOLLOW UP CARE  [place] [facility] - Local Safety Net Dental Clinics [email or web address]   [facility] [phone])  [facility]arl Ma [phone])  [place] [phone])  [facility] [phone])  [facility] [phone]) This clinic caters to the indigent population and is on a lottery system. Location: [employer] [facility], [employer], [address] [place] Clinic Hours: Wednesdays from 6pm - 9pm, patients seen by a lottery system. For dates, call or go to [email or web address] Services: Cleanings, fillings and simple extractions. Payment Options: DENTAL WORK IS FREE OF CHARGE. Bring proof of income or support. Best way to get seen: Arrive at 5:15 pm - this is a lottery, NOT first come/first serve, so arriving earlier will not increase your chances of being seen.     [facility] [phone] Select option 1 for emergencies   Location: [facility], [place], [address] [place] Clinic Hours: No walk-ins accepted - call the day before to schedule an appointment. Check in times are 9:30 am and 1:30 pm. Services: Simple extractions, temporary fillings, pulpectomy/pulp debridement, uncomplicated abscess drainage. Payment Options: PAYMENT IS DUE AT THE TIME OF SERVICE.  Fee is usually $100-200, additional surgical procedures (e.g. abscess drainage) may be extra. Cash, checks, Visa/MasterCard accepted.  Can file Medicaid if patient is covered for dental - patient should call case worker to check. No discount for [facility] patients. Best way to get seen: MUST call the day before and get  onto the schedule. Can usually be seen the next 1-2 days. No walk-ins accepted.     [facility] [phone]   Location: [facility], [address] [place] Clinic Hours: M, W, Th, F 8am or 1:30pm, Tues 9a or 1:30 - first come/first served. Services: Simple extractions, temporary fillings, uncomplicated abscess drainage.  You do not need to be an [facility] resident. Payment Options: PAYMENT IS DUE AT THE TIME OF SERVICE. Dental insurance, otherwise sliding scale - bring proof of income or support. Depending on income and treatment needed, cost is usually $50-200. Best way to get seen: Arrive early as it is first come/first served.     [facility] [phone]   Location: [address] Clinic Hours: Mon-Thu 8a-5p Services: Most basic dental services including extractions and fillings. Payment Options: PAYMENT IS DUE AT THE TIME OF SERVICE. Sliding scale, up to 50% off - bring proof if income or support. Medicaid with dental option accepted. Best way to get seen: Call to schedule an appointment, can usually be seen within 2 weeks OR they will try to see walk-ins - show up at 8a or 2p (you may have to wait).     [facility] [phone] [place] RESIDENTS ONLY   Location: [facility], [address], [place], [place] [address] Clinic Hours: By appointment only. Monday - Thursday 8am-5pm, Friday 8am-12pm Services: Cleanings, fillings, extractions. Payment Options: PAYMENT IS DUE AT THE TIME OF SERVICE. Cash, Visa or MasterCard. Sliding scale - $30 minimum per service. Best way to get seen: Come in to office, complete packet and make an appointment - need proof of income or support monies for each household member and proof of [facility] residence.  Usually takes about a month to get in.     [facility] [phone]   Location: [address], [place] Clinic Hours: Walk-in Urgent Care Dental Services are offered Monday-Friday mornings only. The numbers of emergencies accepted daily is limited to the number of providers available. Maximum 15 - Mondays, Wednesdays & Thursdays Maximum 10 - Tuesdays & Fridays Services: You do not need to be a [facility] resident to be seen for a dental emergency. Emergencies are defined as pain, swelling, abnormal bleeding, or dental trauma. Walkins will receive x-rays if needed. NOTE: Dental cleaning is not an emergency. Payment Options: PAYMENT IS DUE AT THE TIME OF SERVICE. Minimum co-pay is $40.00 for uninsured patients. Minimum co-pay is $3.00 for Medicaid with dental coverage. Dental Insurance is accepted and must be presented at time of visit. Medicare does not cover dental. Forms of payment: Cash, credit card, checks. Best way to get seen: If not previously registered with the clinic, walk-in dental registration begins at 7:15 am and is on a first come/first serve basis. If previously registered with the clinic, call to make an appointment.     [facility] [place] ONLY   Location: [address] [place], [place] Clinic Hours: Mon-Thu 10a-2p Services: Extractions only! Payment Options: FREE (donations accepted) - bring proof of income or support Best way to get seen: Call and schedule an appointment OR come at 8am on the 1st Monday of every month (except for holidays) when it is first come/first served.     [facility] [phone]   Location: [place], [place] Clinic Hours: Friday mornings Services, Payment Options, Best way to get seen: Call for info

## 2018-05-22 NOTE — ED Triage Notes (Signed)
Patient to ER for c/o right upper dental pain. Patient states pain is intermittently worse. Denies any fevers.

## 2018-05-22 NOTE — ED Provider Notes (Signed)
[facility] Emergency Department Provider Note ____________________________________________  Time seen: 2245  I have reviewed the triage vital signs and the nursing notes.  HISTORY  Chief Complaint  Dental Pain  HPI Anas Keye is a 33 y.o. male presents himself to the ED for evaluation of dental pain to the right upper jaw.  Patient describes pain to the right third molar which is admittedly broken and decayed.  Patient denies any interim fevers but is reporting some mild nausea without vomiting.  He is not been able to establish care with there dental provider for routine dental care or extractions.  He presents now for dental pain as noted.  History reviewed. No pertinent past medical history.  There are no active problems to display for this patient.  History reviewed. No pertinent surgical history.  Prior to Admission medications   Medication Sig Start Date End Date Taking? Authorizing Provider  amoxicillin (AMOXIL) 500 MG capsule Take 1 capsule (500 mg total) by mouth 3 (three) times daily. 05/22/18   Ellah Otte, Charlesetta Ivory, PA-C  fluticasone (FLONASE) 50 MCG/ACT nasal spray Place 1 spray into both nostrils daily. 02/21/18 02/21/19  Orvil Feil, PA-C  traMADol (ULTRAM) 50 MG tablet Take 1 tablet (50 mg total) by mouth 3 (three) times daily as needed for up to 2 days. 05/22/18 05/24/18  Briahnna Harries, Charlesetta Ivory, PA-C    Allergies Patient has no known allergies.  No family history on file.  Social History Social History   Tobacco Use  . Smoking status: Current Every Day Smoker    Packs/day: 0.50    Types: Cigarettes  . Smokeless tobacco: Never Used  Substance Use Topics  . Alcohol use: No  . Drug use: No    Review of Systems  Constitutional: Negative for fever. Eyes: Negative for visual changes. ENT: Negative for sore throat.  Right upper dental pain as above. Cardiovascular: Negative for chest pain. Respiratory: Negative for shortness of  breath. Gastrointestinal: Negative for abdominal pain, vomiting and diarrhea. Musculoskeletal: Negative for back pain. Skin: Negative for rash. Neurological: Negative for headaches, focal weakness or numbness. ____________________________________________  PHYSICAL EXAM:  VITAL SIGNS: ED Triage Vitals  Enc Vitals Group     BP 05/22/18 2115 120/62     Pulse Rate 05/22/18 2115 73     Resp 05/22/18 2115 16     Temp 05/22/18 2115 98.2 F (36.8 C)     Temp Source 05/22/18 2115 Oral     SpO2 05/22/18 2115 98 %     Weight 05/22/18 2116 170 lb (77.1 kg)     Height 05/22/18 2116 5\' 5"  (1.651 m)     Head Circumference --      Peak Flow --      Pain Score 05/22/18 2120 3     Pain Loc --      Pain Edu? --      Excl. in GC? --     Constitutional: Alert and oriented. Well appearing and in no distress. Head: Normocephalic and atraumatic. Eyes: Conjunctivae are normal. Normal extraocular movements Mouth/Throat: Mucous membranes are moist.  Uvula is midline and tonsils are flat.  No oropharyngeal lesions are appreciated.  Patient with poor dentition noted throughout.  The right upper third molar is chronically broken and decayed to the gumline.  No focal gum swelling, pointing, fluctuance, or spontaneous drainage is appreciated.  No TMJ dysfunction is elicited. Neck: Supple. No thyromegaly. Hematological/Lymphatic/Immunological: No cervical lymphadenopathy. Cardiovascular: Normal rate, regular rhythm. Normal distal  pulses. Respiratory: Normal respiratory effort. No wheezes/rales/rhonchi. appreciated. Skin:  Skin is warm, dry and intact. No rash noted. Psychiatric: Mood and affect are normal. Patient exhibits appropriate insight and judgment. ____________________________________________  PROCEDURES  Procedures Amoxicillin 500 mg PO ____________________________________________  INITIAL IMPRESSION / ASSESSMENT AND PLAN / ED COURSE  Patient with ED evaluation of pain secondary to dental  caries.  Patient's clinical picture is concerning for dental pain with underlying dental abscess.  He will be treated empirically with amoxicillin.  A small prescription for #6 tramadol was provided for moderate to severe pain relief.  He is again referred to multiple dental providers for routine and definitive treatment.  I reviewed the patient's prescription history over the last 12 months in the multi-state controlled substances database(s) that includes [place], [place], [place], [place], [place], [place], [place], [place], [place], [place], [place], [place], and [place].  Results were notable for no current prescriptions  ____________________________________________  FINAL CLINICAL IMPRESSION(S) / ED DIAGNOSES  Final diagnoses:  Pain due to dental caries      Karmen StabsMenshew, Charlesetta IvoryJenise V Bacon, PA-C 05/22/18 2314    Phineas SemenGoodman, [place], MD 05/23/18 [phone]

## 2018-05-31 ENCOUNTER — Other Ambulatory Visit: Payer: Self-pay

## 2018-05-31 ENCOUNTER — Emergency Department
Admission: EM | Admit: 2018-05-31 | Discharge: 2018-06-01 | Disposition: A | Discharge status: Home / Self Care | Payer: Self-pay | Attending: Emergency Medicine | Admitting: Emergency Medicine

## 2018-05-31 DIAGNOSIS — K029 Dental caries, unspecified: Secondary | ICD-10-CM | POA: Insufficient documentation

## 2018-05-31 DIAGNOSIS — F17210 Nicotine dependence, cigarettes, uncomplicated: Secondary | ICD-10-CM | POA: Insufficient documentation

## 2018-05-31 MED ORDER — LIDOCAINE VISCOUS HCL 2 % MT SOLN
15.0000 mL | Freq: Once | OROMUCOSAL | Status: AC
  Administered 2018-06-01: 15 mL via OROMUCOSAL

## 2018-05-31 MED ORDER — LIDOCAINE VISCOUS HCL 2 % MT SOLN
15.0000 mL | Freq: Once | OROMUCOSAL | Status: AC
  Administered 2018-05-31: 15 mL via OROMUCOSAL
  Filled 2018-05-31: qty 15

## 2018-05-31 NOTE — ED Provider Notes (Signed)
[facility] Emergency Department Provider Note   First MD Initiated Contact with Patient 05/31/18 2332     (approximate)  I have reviewed the triage vital signs and the nursing notes.   HISTORY  Chief Complaint Dental Pain    HPI Steffin Smedley is a 33 y.o. male presents to the emergency department with right posterior third maxillary molar pain is currently 4 out of 10.   No past medical history on file.  There are no active problems to display for this patient.   No past surgical history on file.  Prior to Admission medications   Medication Sig Start Date End Date Taking? Authorizing Provider  amoxicillin (AMOXIL) 500 MG capsule Take 1 capsule (500 mg total) by mouth 3 (three) times daily. 05/22/18   Menshew, Charlesetta Ivory, PA-C  fluticasone (FLONASE) 50 MCG/ACT nasal spray Place 1 spray into both nostrils daily. 02/21/18 02/21/19  Orvil Feil, PA-C    Allergies Patient has no known allergies.  No family history on file.  Social History Social History   Tobacco Use  . Smoking status: Current Every Day Smoker    Packs/day: 0.50    Types: Cigarettes  . Smokeless tobacco: Never Used  Substance Use Topics  . Alcohol use: No  . Drug use: No    Review of Systems Constitutional: No fever/chills Eyes: No visual changes. ENT: No sore throat.  Positive for dental caries Cardiovascular: Denies chest pain. Respiratory: Denies shortness of breath. Gastrointestinal: No abdominal pain.  No nausea, no vomiting.  No diarrhea.  No constipation. Genitourinary: Negative for dysuria. Musculoskeletal: Negative for neck pain.  Negative for back pain. Integumentary: Negative for rash. Neurological: Negative for headaches, focal weakness or numbness.   ____________________________________________   PHYSICAL EXAM:  VITAL SIGNS: ED Triage Vitals  Enc Vitals Group     BP 05/31/18 2019 122/71     Pulse Rate 05/31/18 2019 85     Resp 05/31/18  2019 18     Temp 05/31/18 2019 98 F (36.7 C)     Temp Source 05/31/18 2019 Oral     SpO2 05/31/18 2019 98 %     Weight 05/31/18 2019 77.1 kg (170 lb)     Height 05/31/18 2019 1.651 m (5\' 5" )     Head Circumference --      Peak Flow --      Pain Score 05/31/18 2020 5     Pain Loc --      Pain Edu? --      Excl. in GC? --     Constitutional: Alert and oriented. Well appearing and in no acute distress. Eyes: Conjunctivae are normal. Nose: No congestion/rhinnorhea. Mouth/Throat: Mucous membranes are moist. Oropharynx non-erythematous.  Multiple dental caries.  No abnormal mass noted floor of the mouth. Neck: No stridor.   Cardiovascular: Normal rate, regular rhythm. Good peripheral circulation. Grossly normal heart sounds. Respiratory: Normal respiratory effort.  No retractions. Lungs CTAB. Neurologic:  Normal speech and language. No gross focal neurologic deficits are appreciated.  Skin:  Skin is warm, dry and intact. No rash noted.    Procedures   ____________________________________________   INITIAL IMPRESSION / ASSESSMENT AND PLAN / ED COURSE  As part of my medical decision making, I reviewed the following data within the electronic MEDICAL RECORD NUMBER   33 year old male returns to the emergency department secondary to dental pain.  Patient given viscous lidocaine swish and spit.  Patient was already prescribed amoxicillin on 05/22/2018 which  he states that he is taking.  Advised patient to follow-up with the dentist.  Patient referred to [facility] ____________________________________________  FINAL CLINICAL IMPRESSION(S) / ED DIAGNOSES  Final diagnoses:  Dental caries     MEDICATIONS GIVEN DURING THIS VISIT:  Medications  lidocaine (XYLOCAINE) 2 % viscous mouth solution 15 mL (has no administration in time range)  lidocaine (XYLOCAINE) 2 % viscous mouth solution 15 mL (has no administration in time range)  lidocaine (XYLOCAINE) 2 % viscous  mouth solution 15 mL (15 mLs Mouth/Throat Given 05/31/18 2345)     ED Discharge Orders    None       Note:  This document was prepared using Dragon voice recognition software and may include unintentional dictation errors.   Darci Current, MD 05/31/18 [phone]

## 2018-05-31 NOTE — ED Triage Notes (Signed)
Pt in with co toothache since today.

## 2020-07-28 ENCOUNTER — Emergency Department
Admission: EM | Admit: 2020-07-28 | Discharge: 2020-07-28 | Disposition: A | Discharge status: Home / Self Care | Payer: Self-pay | Attending: Emergency Medicine | Admitting: Emergency Medicine

## 2020-07-28 ENCOUNTER — Other Ambulatory Visit: Payer: Self-pay

## 2020-07-28 ENCOUNTER — Emergency Department: Payer: Self-pay

## 2020-07-28 DIAGNOSIS — F17210 Nicotine dependence, cigarettes, uncomplicated: Secondary | ICD-10-CM | POA: Insufficient documentation

## 2020-07-28 DIAGNOSIS — M5412 Radiculopathy, cervical region: Secondary | ICD-10-CM | POA: Insufficient documentation

## 2020-07-28 MED ORDER — PREDNISONE 20 MG PO TABS: 60.0000 mg | ORAL_TABLET | Freq: Every day | ORAL | 0 refills | Status: AC

## 2020-07-28 MED ORDER — CYCLOBENZAPRINE HCL 5 MG PO TABS: 5.0000 mg | ORAL_TABLET | Freq: Three times a day (TID) | ORAL | 0 refills | Status: DC | PRN

## 2020-07-28 NOTE — ED Triage Notes (Signed)
Pt states L shoulder pain x 2 days. States numbness as well in L hand. Denies injury.  A&O, ambulatory.

## 2020-07-28 NOTE — ED Provider Notes (Signed)
[facility] Provider Note   ____________________________________________   Event Date/Time   First MD Initiated Contact with Patient 07/28/20 1904     (approximate)  I have reviewed the triage vital signs and the nursing notes.   HISTORY  Chief Complaint Shoulder Pain    HPI [facility] is a 35 y.o. male with no significant past medical history who presents to the ED complaining of shoulder pain.  Patient reports that he has had intermittent issues with pain starting in his neck and radiating down towards his shoulder and left arm for at least the past year.  Pain is described as sharp and tingling with some numbness affecting his left hand.  Pain has seemed to flareup over the past couple of days and he reports pain with movement of his neck and shoulder.  He denies any specific trauma to his neck or shoulder.  He has never seen a medical provider for this problem before.  He has not taken anything for his symptoms.  He denies any weakness in his left arm.        History reviewed. No pertinent past medical history.  There are no problems to display for this patient.   History reviewed. No pertinent surgical history.  Prior to Admission medications   Medication Sig Start Date End Date Taking? Authorizing Provider  cyclobenzaprine (FLEXERIL) 5 MG tablet Take 1 tablet (5 mg total) by mouth 3 (three) times daily as needed for muscle spasms. 07/28/20  Yes Chesley Noon, MD  predniSONE (DELTASONE) 20 MG tablet Take 3 tablets (60 mg total) by mouth daily with breakfast for 5 days. 07/28/20 08/02/20 Yes Chesley Noon, MD  amoxicillin (AMOXIL) 500 MG capsule Take 1 capsule (500 mg total) by mouth 3 (three) times daily. 05/22/18   Menshew, Charlesetta Ivory, PA-C  fluticasone (FLONASE) 50 MCG/ACT nasal spray Place 1 spray into both nostrils daily. 02/21/18 02/21/19  Orvil Feil, PA-C    Allergies Patient has no known  allergies.  History reviewed. No pertinent family history.  Social History Social History   Tobacco Use  . Smoking status: Current Every Day Smoker    Packs/day: 0.50    Types: Cigarettes  . Smokeless tobacco: Never Used  Vaping Use  . Vaping Use: Never used  Substance Use Topics  . Alcohol use: No  . Drug use: No    Review of Systems  Constitutional: No fever/chills Eyes: No visual changes. ENT: No sore throat. Cardiovascular: Denies chest pain. Respiratory: Denies shortness of breath. Gastrointestinal: No abdominal pain.  No nausea, no vomiting.  No diarrhea.  No constipation. Genitourinary: Negative for dysuria. Musculoskeletal: Negative for back pain.  Positive for neck and shoulder pain. Skin: Negative for rash. Neurological: Negative for headaches or focal weakness, positive for numbness and tingling.  ____________________________________________   PHYSICAL EXAM:  VITAL SIGNS: ED Triage Vitals  Enc Vitals Group     BP 07/28/20 1850 (!) 129/95     Pulse Rate 07/28/20 1850 91     Resp 07/28/20 1850 16     Temp 07/28/20 1850 98.2 F (36.8 C)     Temp Source 07/28/20 1850 Oral     SpO2 07/28/20 1850 97 %     Weight 07/28/20 1851 160 lb (72.6 kg)     Height 07/28/20 1851 5\' 2"  (1.575 m)     Head Circumference --      Peak Flow --      Pain Score 07/28/20 1850  5     Pain Loc --      Pain Edu? --      Excl. in GC? --     Constitutional: Alert and oriented. Eyes: Conjunctivae are normal. Head: Atraumatic. Nose: No congestion/rhinnorhea. Mouth/Throat: Mucous membranes are moist. Neck: Normal ROM, tenderness to palpation over left paraspinal area. Cardiovascular: Normal rate, regular rhythm. Grossly normal heart sounds.  2+ radial pulses bilaterally. Respiratory: Normal respiratory effort.  No retractions. Lungs CTAB. Gastrointestinal: Soft and nontender. No distention. Genitourinary: deferred Musculoskeletal: No lower extremity tenderness nor edema.   Tenderness to palpation over left shoulder with no obvious deformity. Neurologic:  Normal speech and language. No gross focal neurologic deficits are appreciated. Skin:  Skin is warm, dry and intact. No rash noted. Psychiatric: Mood and affect are normal. Speech and behavior are normal.  ____________________________________________   LABS (all labs ordered are listed, but only abnormal results are displayed)  Labs Reviewed - No data to display ____________________________________________  EKG  ED ECG REPORT I, Chesley Noon, the attending physician, personally viewed and interpreted this ECG.   Date: 07/28/2020  EKG Time: 18:56  Rate: 84  Rhythm: normal sinus rhythm  Axis: Normal  Intervals:none  ST&T Change: None   PROCEDURES  Procedure(s) performed (including Critical Care):  Procedures   ____________________________________________   INITIAL IMPRESSION / ASSESSMENT AND PLAN / ED COURSE       35 year old male presents to the ED with acute on chronic pain extending from his neck to his shoulder with numbness and tingling in his left arm.  Patient is neurovascularly intact to his left arm with 2+ radial pulse and strength intact throughout left upper extremity.  Symptoms do seem consistent with a cervical radiculopathy.  X-rays of left shoulder reviewed by me and show no obvious fracture or dislocation.  EKG shows no evidence of arrhythmia or ischemia.  We will start patient on course of steroids as well as muscle relaxant, he was counseled to take Tylenol or ibuprofen as needed for pain.  He was provided with referral to establish care with PCP and counseled to return to the ED for new worsening symptoms, patient agrees with plan.      ____________________________________________   FINAL CLINICAL IMPRESSION(S) / ED DIAGNOSES  Final diagnoses:  Cervical radiculopathy     ED Discharge Orders         Ordered    predniSONE (DELTASONE) 20 MG tablet  Daily with  breakfast        07/28/20 1929    cyclobenzaprine (FLEXERIL) 5 MG tablet  3 times daily PRN        07/28/20 1929           Note:  This document was prepared using Dragon voice recognition software and may include unintentional dictation errors.   Chesley Noon, MD 07/28/20 [phone]

## 2020-08-31 ENCOUNTER — Other Ambulatory Visit: Payer: Self-pay | Admitting: Orthopedic Surgery

## 2020-08-31 DIAGNOSIS — M4802 Spinal stenosis, cervical region: Secondary | ICD-10-CM

## 2020-09-12 ENCOUNTER — Ambulatory Visit: Admission: RE | Admit: 2020-09-12 | Payer: Self-pay | Source: Ambulatory Visit

## 2020-09-19 ENCOUNTER — Ambulatory Visit
Admission: RE | Admit: 2020-09-19 | Discharge: 2020-09-19 | Disposition: A | Discharge status: Home / Self Care | Payer: Self-pay | Source: Ambulatory Visit | Attending: Orthopedic Surgery | Admitting: Orthopedic Surgery

## 2020-09-19 ENCOUNTER — Other Ambulatory Visit: Payer: Self-pay

## 2020-09-19 DIAGNOSIS — M4802 Spinal stenosis, cervical region: Secondary | ICD-10-CM | POA: Insufficient documentation

## 2021-02-16 ENCOUNTER — Other Ambulatory Visit: Payer: Self-pay

## 2021-02-16 ENCOUNTER — Emergency Department
Admission: EM | Admit: 2021-02-16 | Discharge: 2021-02-16 | Disposition: A | Discharge status: Home / Self Care | Payer: 59 | Attending: Emergency Medicine | Admitting: Emergency Medicine

## 2021-02-16 DIAGNOSIS — Z20822 Contact with and (suspected) exposure to covid-19: Secondary | ICD-10-CM | POA: Diagnosis not present

## 2021-02-16 DIAGNOSIS — R059 Cough, unspecified: Secondary | ICD-10-CM | POA: Insufficient documentation

## 2021-02-16 DIAGNOSIS — F17210 Nicotine dependence, cigarettes, uncomplicated: Secondary | ICD-10-CM | POA: Insufficient documentation

## 2021-02-16 LAB — RESP PANEL BY RT-PCR (FLU A&B, COVID) ARPGX2
Influenza A by PCR: NEGATIVE
Influenza B by PCR: NEGATIVE
SARS Coronavirus 2 by RT PCR: NEGATIVE

## 2021-02-16 NOTE — ED Provider Notes (Signed)
Emergency Medicine Provider Triage Evaluation Note  [facility] , a 35 y.o. male  was evaluated in triage.  Pt complains of cough and body aches since yesterday. Exposure to COVID a few days ago.  Review of Systems  Positive: Cough, Body aches Negative: Fever  Physical Exam  There were no vitals taken for this visit. Gen:   Awake, no distress   Resp:  Normal effort  MSK:   Moves extremities without difficulty  Other:    Medical Decision Making  Medically screening exam initiated at 11:24 AM.  Appropriate orders placed.  Deklen Popelka was informed that the remainder of the evaluation will be completed by another provider, this initial triage assessment does not replace that evaluation, and the importance of remaining in the ED until their evaluation is complete.   Chinita Pester, FNP 02/16/21 1127    Jene Every, MD 02/16/21 1157

## 2021-02-16 NOTE — ED Provider Notes (Signed)
[facility]  ____________________________________________   Event Date/Time   First MD Initiated Contact with Patient 02/16/21 1155     (approximate)  I have reviewed the triage vital signs and the nursing notes.   HISTORY  Chief Complaint Cough    HPI [facility] is a 35 y.o. male presents emergency department complaining of cough symptoms started yesterday.  States he was exposed to someone who tested positive for COVID.  Saw them approximately 2 days ago.  He denies fever or chills.  No chest pain or shortness of breath.  No past medical history on file.  There are no problems to display for this patient.   No past surgical history on file.  Prior to Admission medications   Not on File    Allergies Patient has no known allergies.  No family history on file.  Social History Social History   Tobacco Use   Smoking status: Every Day    Packs/day: 0.50    Types: Cigarettes   Smokeless tobacco: Never  Vaping Use   Vaping Use: Never used  Substance Use Topics   Alcohol use: No   Drug use: No    Review of Systems  Constitutional: No fever/chills Eyes: No visual changes. ENT: No sore throat. Respiratory: Positive cough Cardiovascular: Denies chest pain Gastrointestinal: Denies abdominal pain Genitourinary: Negative for dysuria. Musculoskeletal: Negative for back pain. Skin: Negative for rash. Psychiatric: no mood changes,     ____________________________________________   PHYSICAL EXAM:  VITAL SIGNS: ED Triage Vitals  Enc Vitals Group     BP 02/16/21 1125 127/88     Pulse Rate 02/16/21 1125 82     Resp 02/16/21 1125 20     Temp 02/16/21 1125 98.2 F (36.8 C)     Temp Source 02/16/21 1125 Oral     SpO2 02/16/21 1125 98 %     Weight 02/16/21 1127 160 lb (72.6 kg)     Height 02/16/21 1127 5\' 5"  (1.651 m)     Head Circumference --      Peak Flow --      Pain Score 02/16/21 1127  0     Pain Loc --      Pain Edu? --      Excl. in GC? --     Constitutional: Alert and oriented. Well appearing and in no acute distress. Eyes: Conjunctivae are normal.  Head: Atraumatic. Nose: No congestion/rhinnorhea. Mouth/Throat: Mucous membranes are moist.   Neck:  supple no lymphadenopathy noted Cardiovascular: Normal rate, regular rhythm. Heart sounds are normal Respiratory: Normal respiratory effort.  No retractions, lungs c t a  GU: deferred Musculoskeletal: FROM all extremities, warm and well perfused Neurologic:  Normal speech and language.  Skin:  Skin is warm, dry and intact. No rash noted. Psychiatric: Mood and affect are normal. Speech and behavior are normal.  ____________________________________________   LABS (all labs ordered are listed, but only abnormal results are displayed)  Labs Reviewed  RESP PANEL BY RT-PCR (FLU A&B, COVID) [id number]   ____________________________________________   ____________________________________________  RADIOLOGY    ____________________________________________   PROCEDURES  Procedure(s) performed: No  Procedures    ____________________________________________   INITIAL IMPRESSION / ASSESSMENT AND PLAN / ED COURSE  Pertinent labs & imaging results that were available during my care of the patient were reviewed by me and considered in my medical decision making (see chart for details).   Patient is 35 year old male presents emergency department with URI symptoms.  See HPI.  Physical exam is more consistent with COVID.  Respiratory panel is negative.  Patient is using over-the-counter medications.  Take over-the-counter vitamins.  Over-the-counter cough medicine.  Return emergency department worsening.  Explained to him that there is an incubation period for covid and he may test positive later this week.  He should wear a mask and remain quarantined as much as possible.  Discharged in stable condition.      [facility] was evaluated in Emergency Department on 02/16/2021 for the symptoms described in the history of present illness. He was evaluated in the context of the global COVID-19 pandemic, which necessitated consideration that the patient might be at risk for infection with the SARS-CoV-2 virus that causes COVID-19. Institutional protocols and algorithms that pertain to the evaluation of patients at risk for COVID-19 are in a state of rapid change based on information released by regulatory bodies including the CDC and federal and state organizations. These policies and algorithms were followed during the patient's care in the ED.    As part of my medical decision making, I reviewed the following data within the electronic MEDICAL RECORD NUMBER Nursing notes reviewed and incorporated, Labs reviewed , Old chart reviewed, Notes from prior ED visits, and [place] Controlled Substance Database  ____________________________________________   FINAL CLINICAL IMPRESSION(S) / ED DIAGNOSES  Final diagnoses:  Suspected COVID-19 virus infection      NEW MEDICATIONS STARTED DURING THIS VISIT:  Discharge Medication List as of 02/16/2021 12:03 PM       [facility]:  This document was prepared using Dragon voice recognition software and may include unintentional dictation errors.    Faythe Ghee, PA-C 02/16/21 1247    Jene Every, MD 02/16/21 [phone]

## 2021-02-16 NOTE — Discharge Instructions (Signed)

## 2021-02-16 NOTE — ED Notes (Signed)
PT reports productive cough, body aches and feeling "sluggish" which is abnormal for him. HX of bronchitis previous and recent covid exposure. No known fevers.

## 2021-02-16 NOTE — ED Triage Notes (Signed)
Pt states that he started having cough symptoms yesterday, reports hx of bronchitis, states that he feels like he maybe starting to have bronchitis, pt also states that he was around family members this weekend who just tested positive for covid

## 2021-04-05 ENCOUNTER — Encounter: Payer: Self-pay | Admitting: Emergency Medicine

## 2021-04-05 ENCOUNTER — Other Ambulatory Visit: Payer: Self-pay

## 2021-04-05 ENCOUNTER — Emergency Department
Admission: EM | Admit: 2021-04-05 | Discharge: 2021-04-05 | Disposition: A | Discharge status: Home / Self Care | Payer: 59 | Attending: Emergency Medicine | Admitting: Emergency Medicine

## 2021-04-05 DIAGNOSIS — M5412 Radiculopathy, cervical region: Secondary | ICD-10-CM | POA: Insufficient documentation

## 2021-04-05 DIAGNOSIS — F17210 Nicotine dependence, cigarettes, uncomplicated: Secondary | ICD-10-CM | POA: Diagnosis not present

## 2021-04-05 DIAGNOSIS — M542 Cervicalgia: Secondary | ICD-10-CM | POA: Diagnosis present

## 2021-04-05 DIAGNOSIS — M79602 Pain in left arm: Secondary | ICD-10-CM | POA: Insufficient documentation

## 2021-04-05 MED ORDER — PREDNISONE 10 MG PO TABS: 10.0000 mg | ORAL_TABLET | Freq: Every day | ORAL | 0 refills | Status: DC

## 2021-04-05 MED ORDER — GABAPENTIN 100 MG PO CAPS: 100.0000 mg | ORAL_CAPSULE | Freq: Three times a day (TID) | ORAL | 0 refills | Status: AC | PRN

## 2021-04-05 NOTE — ED Triage Notes (Signed)
Presents with pain to posterior left shoulder and into left arm  states pain started about 1 year ago  has been intermittent   pain has increased over the past couple of days

## 2021-04-05 NOTE — ED Provider Notes (Signed)
[facility] Emergency Department Provider Note  Time seen: 10:44 AM  I have reviewed the triage vital signs and the nursing notes.   HISTORY  Chief Complaint Shoulder Pain   HPI [facility] is a 35 y.o. male with a past medical history of cervical bulging disks who presents to the emergency department for left neck/arm pain.  According to the patient for the last 1.5 years he has been experiencing intermittent pain to his left neck down into the left arm.  Patient has been seen by orthopedics has been diagnosed with a bulging disc.  Patient does get corticosteroid injections to the neck last got 1 in August.  Patient states proximately 1 month ago he had a flareup of pain in the left neck/left shoulder when on prednisone and gabapentin which provided relief however for the past 2 days the pain is flared up once again.  Denies any weakness to the left arm.   History reviewed. No pertinent past medical history.  There are no problems to display for this patient.   History reviewed. No pertinent surgical history.  Prior to Admission medications   Not on File    No Known Allergies  No family history on file.  Social History Social History   Tobacco Use   Smoking status: Every Day    Packs/day: 0.50    Types: Cigarettes   Smokeless tobacco: Never  Vaping Use   Vaping Use: Never used  Substance Use Topics   Alcohol use: No   Drug use: No    Review of Systems Constitutional: Negative for fever. Cardiovascular: Negative for chest pain. Respiratory: Negative for shortness of breath. Gastrointestinal: Negative for abdominal pain Musculoskeletal: Left shoulder pain.  Left neck pain. Skin: Negative for skin complaints  Neurological: Negative for headache All other ROS negative  ____________________________________________   PHYSICAL EXAM:  VITAL SIGNS: ED Triage Vitals  Enc Vitals Group     BP 04/05/21 1025 128/88     Pulse Rate 04/05/21  1025 84     Resp 04/05/21 1025 18     Temp 04/05/21 1025 98.1 F (36.7 C)     Temp Source 04/05/21 1025 Oral     SpO2 04/05/21 1025 98 %     Weight 04/05/21 1022 160 lb 0.9 oz (72.6 kg)     Height 04/05/21 1022 5\' 5"  (1.651 m)     Head Circumference --      Peak Flow --      Pain Score 04/05/21 1025 8     Pain Loc --      Pain Edu? --      Excl. in GC? --     Constitutional: Alert and oriented. Well appearing and in no distress. Eyes: Normal exam ENT      Head: Normocephalic and atraumatic.      Mouth/Throat: Mucous membranes are moist. Cardiovascular: Normal rate, regular rhythm. Respiratory: Normal respiratory effort without tachypnea nor retractions. Breath sounds are clear Gastrointestinal: Soft and nontender. No distention Musculoskeletal: Pain in the left neck.  Neurovascularly intact left upper extremity. Neurologic:  Normal speech and language. No gross focal neurologic deficits Skin:  Skin is warm, dry and intact.  Psychiatric: Mood and affect are normal. Speech and behavior are normal.   ____________________________________________   INITIAL IMPRESSION / ASSESSMENT AND PLAN / ED COURSE  Pertinent labs & imaging results that were available during my care of the patient were reviewed by me and considered in my medical decision making (see  chart for details).   Patient presents emergency department with a history of recurrent left neck/shoulder pain with an exacerbation of his chronic pain.  History and examination most consistent with cervical radiculopathy.  Patient states last time he improved with prednisone and gabapentin.  We will discharge with a prednisone taper as well as a short course of gabapentin have the patient follow-up with orthopedics.  Patient agreeable to plan of care.  [facility] was evaluated in Emergency Department on 04/05/2021 for the symptoms described in the history of present illness. He was evaluated in the context of the global COVID-19  pandemic, which necessitated consideration that the patient might be at risk for infection with the SARS-CoV-2 virus that causes COVID-19. Institutional protocols and algorithms that pertain to the evaluation of patients at risk for COVID-19 are in a state of rapid change based on information released by regulatory bodies including the CDC and federal and state organizations. These policies and algorithms were followed during the patient's care in the ED.  ____________________________________________   FINAL CLINICAL IMPRESSION(S) / ED DIAGNOSES  Cervical radiculopathy   Minna Antis, MD 04/05/21 1048

## 2021-04-08 ENCOUNTER — Encounter: Payer: Self-pay | Admitting: Emergency Medicine

## 2021-04-08 ENCOUNTER — Emergency Department
Admission: EM | Admit: 2021-04-08 | Discharge: 2021-04-08 | Disposition: A | Discharge status: Home / Self Care | Payer: 59 | Attending: Emergency Medicine | Admitting: Emergency Medicine

## 2021-04-08 ENCOUNTER — Other Ambulatory Visit: Payer: Self-pay

## 2021-04-08 DIAGNOSIS — F17210 Nicotine dependence, cigarettes, uncomplicated: Secondary | ICD-10-CM | POA: Insufficient documentation

## 2021-04-08 DIAGNOSIS — K08409 Partial loss of teeth, unspecified cause, unspecified class: Secondary | ICD-10-CM | POA: Insufficient documentation

## 2021-04-08 DIAGNOSIS — K0889 Other specified disorders of teeth and supporting structures: Secondary | ICD-10-CM

## 2021-04-08 MED ORDER — HYDROCODONE-ACETAMINOPHEN 7.5-325 MG/15ML PO SOLN: 15.0000 mL | Freq: Three times a day (TID) | ORAL | 0 refills | Status: AC | PRN

## 2021-04-08 MED ORDER — KETOROLAC TROMETHAMINE 30 MG/ML IJ SOLN
30.0000 mg | Freq: Once | INTRAMUSCULAR | Status: AC
  Administered 2021-04-08: 14:00:00 30 mg via INTRAMUSCULAR
  Filled 2021-04-08: qty 1

## 2021-04-08 NOTE — ED Provider Notes (Signed)
[facility] Emergency Department Provider Note ____________________________________________  Time seen: 1357  I have reviewed the triage vital signs and the nursing notes.  HISTORY  Chief Complaint  Dental Pain   HPI Bryan [facility] is a 35 y.o. male with noncontributory medical history, presents to the ED with acute dental pain.  Patient reports pain after recent total dental extractions, for full dentures.  He is about 3 hours status post extractions by local provider.  He reports the dental provider did not provide him with any pain medicine at the time of disposition.  He reported back to the dental office, and the provider declined any medicines at that time patient denies any nausea, vomiting, or fevers. He presents with temporary dentures in place. He notes some intermittent bleeding.   History reviewed. No pertinent past medical history.  There are no problems to display for this patient.   History reviewed. No pertinent surgical history.  Prior to Admission medications   Medication Sig Start Date End Date Taking? Authorizing Provider  HYDROcodone-acetaminophen (HYCET) 7.5-325 mg/15 ml solution Take 15 mLs by mouth 3 (three) times daily as needed for up to 3 days for moderate pain. 04/08/21 04/11/21 Yes [place] Hawbaker, Charlesetta Ivory, PA-C  predniSONE (STERAPRED UNI-PAK 21 TAB) 5 MG (21) TBPK tablet Take 5 mg by mouth daily.   Yes [provider]  gabapentin (NEURONTIN) 100 MG capsule Take 1 capsule (100 mg total) by mouth 3 (three) times daily as needed (neck pain). 04/05/21 05/05/21  Minna Antis, MD    Allergies Patient has no known allergies.  History reviewed. No pertinent family history.  Social History Social History   Tobacco Use   Smoking status: Every Day    Packs/day: 0.50    Types: Cigarettes   Smokeless tobacco: Never  Vaping Use   Vaping Use: Never used  Substance Use Topics   Alcohol use: No   Drug use: No    Review  of Systems  Constitutional: Negative for fever. Eyes: Negative for visual changes. ENT: Negative for sore throat. Dental pain s/p procedure Cardiovascular: Negative for chest pain. Respiratory: Negative for shortness of breath. Gastrointestinal: Negative for abdominal pain, vomiting and diarrhea. Genitourinary: Negative for dysuria. Musculoskeletal: Negative for back pain. Skin: Negative for rash. Neurological: Negative for headaches, focal weakness or numbness. ____________________________________________  PHYSICAL EXAM:  VITAL SIGNS: ED Triage Vitals  Enc Vitals Group     BP 04/08/21 1354 121/84     Pulse Rate 04/08/21 1354 97     Resp 04/08/21 1354 20     Temp 04/08/21 1354 98.4 F (36.9 C)     Temp Source 04/08/21 1354 Oral     SpO2 04/08/21 1354 96 %     Weight 04/08/21 1328 160 lb 0.9 oz (72.6 kg)     Height 04/08/21 1328 5\' 5"  (1.651 m)     Head Circumference --      Peak Flow --      Pain Score 04/08/21 1328 8     Pain Loc --      Pain Edu? --      Excl. in GC? --     Constitutional: Alert and oriented. Well appearing and in no distress. Head: Normocephalic and atraumatic. Eyes: Conjunctivae are normal. Normal extraocular movements Mouth/Throat: Mucous membranes are moist.  Patient with two full dentures in place to the upper and lower gum.  Bloodsoaked gauze is noted under the dentures.  Uvula is midline and tonsils are flat. Neck: Supple.  No thyromegaly. Hematological/Lymphatic/Immunological: No cervical lymphadenopathy. Cardiovascular: Normal rate, regular rhythm. Normal distal pulses. Respiratory: Normal respiratory effort. No wheezes/rales/rhonchi. Gastrointestinal: Soft and nontender. No distention. Musculoskeletal: Nontender with normal range of motion in all extremities.  Neurologic:  Normal gait without ataxia. Normal speech and language. No gross focal neurologic deficits are appreciated. Skin:  Skin is warm, dry and intact. No rash  noted. Psychiatric: Mood and affect are normal. Patient exhibits appropriate insight and judgment. ____________________________________________    {LABS (pertinent positives/negatives)  ____________________________________________  {EKG  ____________________________________________   RADIOLOGY Official radiology report(s): No results found. ____________________________________________  PROCEDURES  Toradol 30 mg IM  Procedures ____________________________________________   INITIAL IMPRESSION / ASSESSMENT AND PLAN / ED COURSE  As part of my medical decision making, I reviewed the following data within the electronic MEDICAL RECORD NUMBER Notes from prior ED visits and [place] Controlled Substance Database   DDX: dry socket, hemorrhage, dentalgia  Patient is status post full dental extraction with temporary dentures in place.  He presents for evaluation of pain and some continued bleeding.  Patient evaluated for his complaint, and found to be in stable condition.  Bleeding is currently controlled and postprocedure dressing is in place.  Patient is treated with an IM injection of Toradol, and a prescription for Hycet is provided as requested.  He is going to return to the dental provider for interim wound care and dressing change peer return precautions have been reviewed.  [name] was evaluated in Emergency Department on 04/08/2021 for the symptoms described in the history of present illness. He was evaluated in the context of the global COVID-19 pandemic, which necessitated consideration that the patient might be at risk for infection with the SARS-CoV-2 virus that causes COVID-19. Institutional protocols and algorithms that pertain to the evaluation of patients at risk for COVID-19 are in a state of rapid change based on information released by regulatory bodies including the CDC and federal and state organizations. These policies and algorithms were followed during the patient's care in  the ED.  I reviewed the patient's prescription history over the last 12 months in the multi-state controlled substances database(s) that includes [place], [place], [place], [place], [place], [place], [place], [place], [place], [place], [place], [place], [place], and [place].  Results were notable for no current RX. ____________________________________________  FINAL CLINICAL IMPRESSION(S) / ED DIAGNOSES  Final diagnoses:  History of tooth extraction, unspecified edentulism class  [address], Charlesetta Ivory, PA-C 04/08/21 1518    Merwyn Katos, MD 04/08/21 [phone]

## 2021-04-08 NOTE — ED Triage Notes (Signed)
Presents with some dental pain  s/p dental extrations

## 2021-04-08 NOTE — Discharge Instructions (Signed)
Follow-up with a dental provider for any interim care.  Take the narcotic pain medicine syrup as needed.  Be careful as it can cause drowsiness.

## 2021-04-12 ENCOUNTER — Emergency Department
Admission: EM | Admit: 2021-04-12 | Discharge: 2021-04-12 | Disposition: A | Discharge status: Home / Self Care | Payer: 59 | Attending: Emergency Medicine | Admitting: Emergency Medicine

## 2021-04-12 ENCOUNTER — Other Ambulatory Visit: Payer: Self-pay

## 2021-04-12 DIAGNOSIS — K047 Periapical abscess without sinus: Secondary | ICD-10-CM | POA: Insufficient documentation

## 2021-04-12 DIAGNOSIS — K056 Periodontal disease, unspecified: Secondary | ICD-10-CM

## 2021-04-12 DIAGNOSIS — F17210 Nicotine dependence, cigarettes, uncomplicated: Secondary | ICD-10-CM | POA: Insufficient documentation

## 2021-04-12 DIAGNOSIS — K089 Disorder of teeth and supporting structures, unspecified: Secondary | ICD-10-CM | POA: Diagnosis present

## 2021-04-12 MED ORDER — KETOROLAC TROMETHAMINE 10 MG PO TABS: 10.0000 mg | ORAL_TABLET | Freq: Four times a day (QID) | ORAL | 0 refills | Status: AC | PRN

## 2021-04-12 MED ORDER — OXYCODONE-ACETAMINOPHEN 5-325 MG PO TABS
1.0000 | ORAL_TABLET | Freq: Once | ORAL | Status: AC
  Administered 2021-04-12: 14:00:00 1 via ORAL
  Filled 2021-04-12: qty 1

## 2021-04-12 MED ORDER — CLINDAMYCIN PHOSPHATE 600 MG/4ML IJ SOLN
600.0000 mg | Freq: Once | INTRAMUSCULAR | Status: DC
  Filled 2021-04-12: qty 4

## 2021-04-12 MED ORDER — CLINDAMYCIN HCL 150 MG PO CAPS
300.0000 mg | ORAL_CAPSULE | Freq: Once | ORAL | Status: AC
  Administered 2021-04-12: 14:00:00 300 mg via ORAL
  Filled 2021-04-12: qty 2

## 2021-04-12 MED ORDER — CLINDAMYCIN HCL 150 MG PO CAPS: 300.0000 mg | ORAL_CAPSULE | Freq: Three times a day (TID) | ORAL | 0 refills | Status: AC

## 2021-04-12 MED ORDER — OXYCODONE-ACETAMINOPHEN 5-325 MG PO TABS: 1.0000 | ORAL_TABLET | ORAL | 0 refills | Status: AC | PRN

## 2021-04-12 NOTE — Discharge Instructions (Signed)
Rinse the sockets for the teeth removed with warm water Swish with warm salt water at least 3-4 times a day, this will help the areas heal faster Return emergency department for worsening Call your dentist for follow-up

## 2021-04-12 NOTE — ED Provider Notes (Signed)
[facility] Emergency Department Provider Note  ____________________________________________   Event Date/Time   First MD Initiated Contact with Patient 04/12/21 1250     (approximate)  I have reviewed the triage vital signs and the nursing notes.   HISTORY  Chief Complaint Dental Pain    HPI Bryan Bush is a 35 y.o. male presents emergency department complaining of continued dental infection dental pain.  Patient had all of his teeth pulled last week.  States he has had increasing pain and swelling.  The temporary dentures hurt his gums.  Patient was seen here in the ED and given amoxicillin and pain medication.  Patient states he thinks he is resistant to the amoxicillin.  States he did have infection prior to getting the teeth pulled.  He denies fever or chills.  No chest pain or shortness of breath  No past medical history on file.  There are no problems to display for this patient.   No past surgical history on file.  Prior to Admission medications   Medication Sig Start Date End Date Taking? Authorizing Provider  clindamycin (CLEOCIN) 150 MG capsule Take 2 capsules (300 mg total) by mouth 3 (three) times daily. 04/12/21  Yes Sarit Sparano, Roselyn Bering, PA-C  ketorolac (TORADOL) 10 MG tablet Take 1 tablet (10 mg total) by mouth every 6 (six) hours as needed. 04/12/21  Yes Emeril Stille, Roselyn Bering, PA-C  oxyCODONE-acetaminophen (PERCOCET) 5-325 MG tablet Take 1 tablet by mouth every 4 (four) hours as needed for severe pain. 04/12/21 04/12/22 Yes Valicia Rief, Roselyn Bering, PA-C  gabapentin (NEURONTIN) 100 MG capsule Take 1 capsule (100 mg total) by mouth 3 (three) times daily as needed (neck pain). 04/05/21 05/05/21  Minna Antis, MD  predniSONE (STERAPRED UNI-PAK 21 TAB) 5 MG (21) TBPK tablet Take 5 mg by mouth daily.    [provider]    Allergies Patient has no known allergies.  No family history on file.  Social History Social History   Tobacco Use    Smoking status: Every Day    Packs/day: 0.50    Types: Cigarettes   Smokeless tobacco: Never  Vaping Use   Vaping Use: Never used  Substance Use Topics   Alcohol use: No   Drug use: No    Review of Systems  Constitutional: No fever/chills Eyes: No visual changes. ENT: No sore throat.  Positive dental pain Respiratory: Denies cough Cardiovascular: Denies chest pain Gastrointestinal: Denies abdominal pain Genitourinary: Negative for dysuria. Musculoskeletal: Negative for back pain. Skin: Negative for rash. Psychiatric: no mood changes,     ____________________________________________   PHYSICAL EXAM:  VITAL SIGNS: ED Triage Vitals  Enc Vitals Group     BP 04/12/21 1112 (!) 141/111     Pulse Rate 04/12/21 1112 (!) 110     Resp 04/12/21 1112 18     Temp 04/12/21 1112 98.3 F (36.8 C)     Temp src --      SpO2 04/12/21 1112 98 %     Weight 04/12/21 1129 170 lb (77.1 kg)     Height 04/12/21 1129 5\' 5"  (1.651 m)     Head Circumference --      Peak Flow --      Pain Score 04/12/21 1129 10     Pain Loc --      Pain Edu? --      Excl. in [place]? --     Constitutional: Alert and oriented. Well appearing and in no acute distress. Eyes: Conjunctivae  are normal.  Head: Atraumatic. Nose: No congestion/rhinnorhea.  Mouth has an odor typical of periodontal disease, some of the areas are healing from recent extractions, no active bleeding is noted, no drainage is noted Mouth/Throat: Mucous membranes are moist.   Neck:  supple no lymphadenopathy noted Cardiovascular: Normal rate, regular rhythm. Heart sounds are normal Respiratory: Normal respiratory effort.  No retractions, lungs c t a  GU: deferred Musculoskeletal: FROM all extremities, warm and well perfused Neurologic:  Normal speech and language.  Skin:  Skin is warm, dry and intact. No rash noted. Psychiatric: Mood and affect are normal. Speech and behavior are normal.  ____________________________________________    LABS (all labs ordered are listed, but only abnormal results are displayed)  Labs Reviewed - No data to display ____________________________________________   ____________________________________________  RADIOLOGY    ____________________________________________   PROCEDURES  Procedure(s) performed: No  Procedures    ____________________________________________   INITIAL IMPRESSION / ASSESSMENT AND PLAN / ED COURSE  Pertinent labs & imaging results that were available during my care of the patient were reviewed by me and considered in my medical decision making (see chart for details).   The patient is a 35 year old male presents emergency department with continued dental pain and swelling.  See HPI.  Physical exam shows there to be an odor in the patient's mouth typical of periodontal disease, no active bleeding noted in the mouth.  Gums are tender to palpation  I did explain the findings to the patient.  He is to swish with warm salt water, use the syringe to irrigate the open areas where the teeth were pulled.  Explained to him this would ensure he did not get food trapped in the sockets.  He was given a prescription of clindamycin, he is to stop the amoxicillin.  Additional pain medication.  Strict instructions to follow-up with his dentist.  Explained to him the ER cannot do anything else for him.  He is in agreement treatment plan.  Discharged stable condition.     Bryan Bush was evaluated in Emergency Department on 04/12/2021 for the symptoms described in the history of present illness. He was evaluated in the context of the global COVID-19 pandemic, which necessitated consideration that the patient might be at risk for infection with the SARS-CoV-2 virus that causes COVID-19. Institutional protocols and algorithms that pertain to the evaluation of patients at risk for COVID-19 are in a state of rapid change based on information released by regulatory bodies  including the CDC and federal and state organizations. These policies and algorithms were followed during the patient's care in the ED.    As part of my medical decision making, I reviewed the following data within the electronic MEDICAL RECORD NUMBER Nursing notes reviewed and incorporated, Old chart reviewed, Notes from prior ED visits, and [place] Controlled Substance Database  ____________________________________________   FINAL CLINICAL IMPRESSION(S) / ED DIAGNOSES  Final diagnoses:  Dental infection  Periodontal disease      NEW MEDICATIONS STARTED DURING THIS VISIT:  New Prescriptions   CLINDAMYCIN (CLEOCIN) 150 MG CAPSULE    Take 2 capsules (300 mg total) by mouth 3 (three) times daily.   KETOROLAC (TORADOL) 10 MG TABLET    Take 1 tablet (10 mg total) by mouth every 6 (six) hours as needed.   OXYCODONE-ACETAMINOPHEN (PERCOCET) 5-325 MG TABLET    Take 1 tablet by mouth every 4 (four) hours as needed for severe pain.     Note:  This document was prepared using  [employer] and may include unintentional dictation errors.    Faythe Ghee, PA-C 04/12/21 1348    Arnaldo Natal, MD 04/12/21 [phone]

## 2021-04-12 NOTE — ED Notes (Signed)
Patient states he has had all his teeth pulled by [facility]. They did not provide him with any antibiotics or pain medicine. Patient states he can barely swallow and is in a lot of pain. No fever is shown at this time.

## 2021-04-12 NOTE — ED Triage Notes (Addendum)
Pt via POV c/o bilateral mouth pain and dental pain. He had a complete dental extraction on Thursday 12/22 and has had increased pain, swelling, and oral discharge since then. He received rx for amoxicillin 500mg  cap on 12/24 and has been taking them q6h ever since with no relief. Pain currently rated 10/10 and feels sharp/stabbing/needlelike. No additional complaints or concerns noted. He has not taken any pain relievers today and has only been using OTC tylenol and hydrocodone PRN.

## 2022-06-14 ENCOUNTER — Other Ambulatory Visit: Payer: Self-pay

## 2022-06-14 ENCOUNTER — Emergency Department
Admission: EM | Admit: 2022-06-14 | Discharge: 2022-06-14 | Disposition: A | Discharge status: Home / Self Care | Payer: 59 | Attending: Emergency Medicine | Admitting: Emergency Medicine

## 2022-06-14 DIAGNOSIS — R110 Nausea: Secondary | ICD-10-CM | POA: Insufficient documentation

## 2022-06-14 DIAGNOSIS — Z1152 Encounter for screening for COVID-19: Secondary | ICD-10-CM | POA: Insufficient documentation

## 2022-06-14 DIAGNOSIS — R519 Headache, unspecified: Secondary | ICD-10-CM

## 2022-06-14 LAB — RESP PANEL BY RT-PCR (RSV, FLU A&B, COVID)  RVPGX2
Influenza A by PCR: NEGATIVE
Influenza B by PCR: NEGATIVE
Resp Syncytial Virus by PCR: NEGATIVE
SARS Coronavirus 2 by RT PCR: NEGATIVE

## 2022-06-14 MED ORDER — IBUPROFEN 800 MG PO TABS
800.0000 mg | ORAL_TABLET | Freq: Once | ORAL | Status: AC
  Administered 2022-06-14: 800 mg via ORAL
  Filled 2022-06-14: qty 1

## 2022-06-14 MED ORDER — PROCHLORPERAZINE EDISYLATE 10 MG/2ML IJ SOLN
10.0000 mg | Freq: Once | INTRAMUSCULAR | Status: AC
  Administered 2022-06-14: 10 mg via INTRAVENOUS
  Filled 2022-06-14: qty 2

## 2022-06-14 MED ORDER — SODIUM CHLORIDE 0.9 % IV BOLUS (SEPSIS)
1000.0000 mL | Freq: Once | INTRAVENOUS | Status: AC
  Administered 2022-06-14: 1000 mL via INTRAVENOUS

## 2022-06-14 MED ORDER — DIPHENHYDRAMINE HCL 50 MG/ML IJ SOLN
12.5000 mg | Freq: Once | INTRAMUSCULAR | Status: AC
Start: 2022-06-14 — End: 2022-06-14
  Administered 2022-06-14: 12.5 mg via INTRAVENOUS
  Filled 2022-06-14: qty 1

## 2022-06-14 NOTE — ED Notes (Signed)
Pt stated his pain was worse. MD notified and new orders received.

## 2022-06-14 NOTE — ED Notes (Signed)
Pt brought to ed rm 15 at this time, this RN now assuming care.

## 2022-06-14 NOTE — ED Notes (Signed)
Pt verbalized understanding of DC instructions. Signing pad did not work.

## 2022-06-14 NOTE — ED Triage Notes (Signed)
Pt arrives via POV with CC of headache that has worsened over the last few days. Pt reports mild congestion.

## 2022-06-14 NOTE — Discharge Instructions (Signed)
You may alternate Tylenol 1000 mg every 6 hours as needed for pain, fever and Ibuprofen 800 mg every 6-8 hours as needed for pain, fever.  Please take Ibuprofen with food.  Do not take more than 4000 mg of Tylenol (acetaminophen) in a 24 hour period.  

## 2022-06-14 NOTE — ED Provider Notes (Signed)
[facility] Provider Note    Event Date/Time   First MD Initiated Contact with Patient 06/14/22 0430     (approximate)   History   Headache   HPI  Bryan Bush is a 37 y.o. male with no significant past medical history who presents to the emergency department for left-sided throbbing headache for the past 3 days.  Intermittently improves with over-the-counter Tylenol and ibuprofen but never resolves.  Has had some nausea without vomiting.  No numbness, tingling or weakness.  No head injury.  No fever.   History provided by patient.    History reviewed. No pertinent past medical history.  History reviewed. No pertinent surgical history.  MEDICATIONS:  Prior to Admission medications   Medication Sig Start Date End Date Taking? Authorizing Provider  clindamycin (CLEOCIN) 150 MG capsule Take 2 capsules (300 mg total) by mouth 3 (three) times daily. 04/12/21   Fisher, Linden Dolin, PA-C  gabapentin (NEURONTIN) 100 MG capsule Take 1 capsule (100 mg total) by mouth 3 (three) times daily as needed (neck pain). 04/05/21 05/05/21  Harvest Dark, MD  ketorolac (TORADOL) 10 MG tablet Take 1 tablet (10 mg total) by mouth every 6 (six) hours as needed. 04/12/21   Fisher, Linden Dolin, PA-C  predniSONE (STERAPRED UNI-PAK 21 TAB) 5 MG (21) TBPK tablet Take 5 mg by mouth daily.    [provider]    Physical Exam   Triage Vital Signs: ED Triage Vitals  Enc Vitals Group     BP 06/14/22 0355 132/89     Pulse Rate 06/14/22 0355 88     Resp 06/14/22 0355 17     Temp 06/14/22 0401 98.5 F (36.9 C)     Temp Source 06/14/22 0401 Oral     SpO2 06/14/22 0355 100 %     Weight 06/14/22 0355 161 lb 2.5 oz (73.1 kg)     Height 06/14/22 0355 '5\' 1"'$  (1.549 m)     Head Circumference --      Peak Flow --      Pain Score 06/14/22 0355 4     Pain Loc --      Pain Edu? --      Excl. in [place]? --     Most recent vital signs: Vitals:   06/14/22 0553 06/14/22 0640  BP:  (!) 121/95 119/80  Pulse: 77 71  Resp: 18 18  Temp:    SpO2: 97% 98%    CONSTITUTIONAL: Alert, responds appropriately to questions. Well-appearing; well-nourished HEAD: Normocephalic, atraumatic EYES: Conjunctivae clear, pupils appear equal, sclera nonicteric ENT: normal nose; moist mucous membranes NECK: Supple, normal ROM CARD: RRR; S1 and S2 appreciated RESP: Normal chest excursion without splinting or tachypnea; breath sounds clear and equal bilaterally; no wheezes, no rhonchi, no rales, no hypoxia or respiratory distress, speaking full sentences ABD/GI: Non-distended; soft, non-tender, no rebound, no guarding, no peritoneal signs BACK: The back appears normal EXT: Normal ROM in all joints; no deformity noted, no edema SKIN: Normal color for age and race; warm; no rash on exposed skin NEURO: Moves all extremities equally, normal speech, no facial asymmetry, normal sensation, normal gait PSYCH: The patient's mood and manner are appropriate.   ED Results / Procedures / Treatments   LABS: (all labs ordered are listed, but only abnormal results are displayed) Labs Reviewed  RESP PANEL BY RT-PCR (RSV, FLU A&B, COVID)  [id number]     EKG:  EKG Interpretation  Date/Time:    Ventricular Rate:  PR Interval:    QRS Duration:   QT Interval:    QTC Calculation:   R Axis:     Text Interpretation:           RADIOLOGY: My personal review and interpretation of imaging:    I have personally reviewed all radiology reports.   No results found.   PROCEDURES:  Critical Care performed:      Procedures    IMPRESSION / MDM / [place] / ED COURSE  I reviewed the triage vital signs and the nursing notes.    Patient here with complaints of left-sided headache.  No neurologic deficits.  No fever.     DIFFERENTIAL DIAGNOSIS (includes but not limited to):   Migraine, doubt temporal arteritis or trigeminal neuralgia.  Low suspicion for stroke, meningitis,  intracranial hemorrhage or cavernous sinus thrombosis.   Patient's presentation is most consistent with acute, uncomplicated illness.   PLAN: COVID, flu and RSV from triage negative.  Offered IV medications but patient states he would like to try ibuprofen first.  No indication for emergent head imaging.   MEDICATIONS GIVEN IN ED: Medications  ibuprofen (ADVIL) tablet 800 mg (800 mg Oral Given 06/14/22 0505)  prochlorperazine (COMPAZINE) injection 10 mg (10 mg Intravenous Given 06/14/22 0551)  diphenhydrAMINE (BENADRYL) injection 12.5 mg (12.5 mg Intravenous Given 06/14/22 0552)  sodium chloride 0.9 % bolus 1,000 mL (0 mLs Intravenous Stopped 06/14/22 [id number])     ED COURSE: No improvement with ibuprofen.  Patient now agrees to IV migraine cocktail.   6:40 AM  Pt's headache resolved after migraine cocktail.  Will discharge home.  Recommended Tylenol, Motrin at home for headache and will give outpatient neurology follow-up.   At this time, I do not feel there is any life-threatening condition present. I reviewed all nursing notes, vitals, pertinent previous records.  All lab and urine results, EKGs, imaging ordered have been independently reviewed and interpreted by myself.  I reviewed all available radiology reports from any imaging ordered this visit.  Based on my assessment, I feel the patient is safe to be discharged home without further emergent workup and can continue workup as an outpatient as needed. Discussed all findings, treatment plan as well as usual and customary return precautions.  They verbalize understanding and are comfortable with this plan.  Outpatient follow-up has been provided as needed.  All questions have been answered.    CONSULTS:  none   OUTSIDE RECORDS REVIEWED: Reviewed last internal medicine note on 07/09/2021.       FINAL CLINICAL IMPRESSION(S) / ED DIAGNOSES   Final diagnoses:  Left-sided headache     Rx / DC Orders   ED Discharge Orders      None        Note:  This document was prepared using Dragon voice recognition software and may include unintentional dictation errors.   Zhaniya Swallows, Delice Bison, DO 06/14/22 [phone]

## 2023-03-31 IMAGING — MR MR CERVICAL SPINE W/O CM
5 series · 40 of 48 positions shown · non-contrast
Comparison: None.

CLINICAL DATA: Left-sided neck pain left arm pain over the last 4
months.

EXAM:
MRI CERVICAL SPINE WITHOUT CONTRAST
TECHNIQUE: Multiplanar, multisequence MR imaging of the cervical spine was
performed. No intravenous contrast was administered.

[Series 11: T2 · sagittal · 3.0mm · 0.62mm/px · 7 of 15 slices shown (1 of 2)]
[im 1/15]
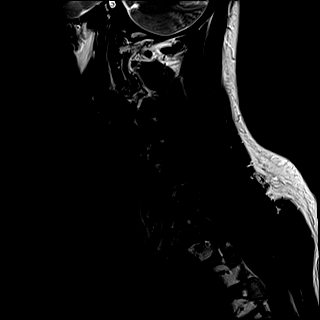
[im 3/15]
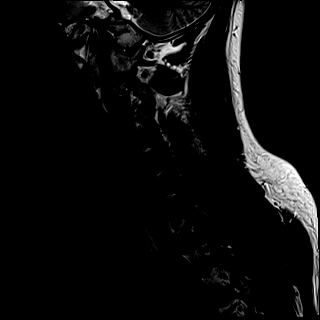
[im 5/15]
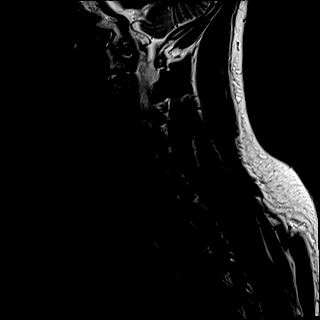
[im 8/15]
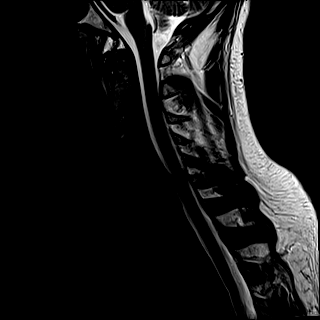
[im 10/15]
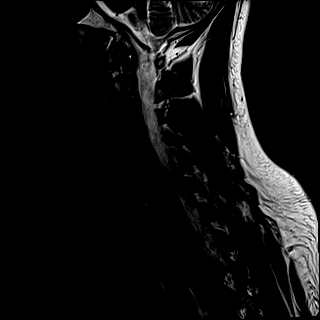
[im 12/15]
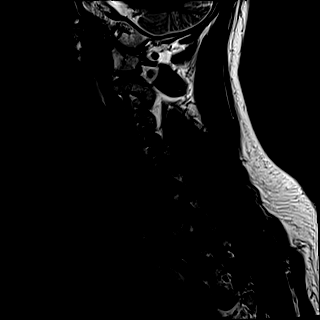
[im 15/15]
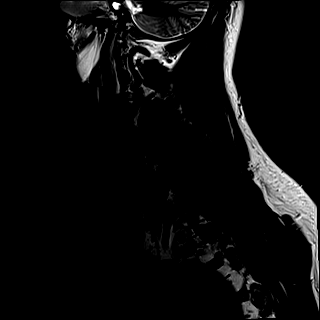

[Series 12: FLAIR · sagittal · 3.0mm · 0.78mm/px · 7 of 15 slices shown]
[im 1/15]
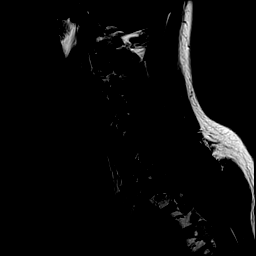
[im 3/15]
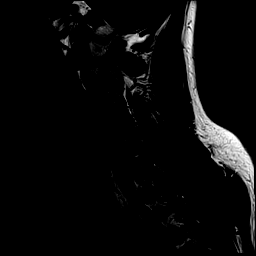
[im 5/15]
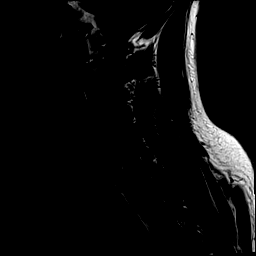
[im 8/15]
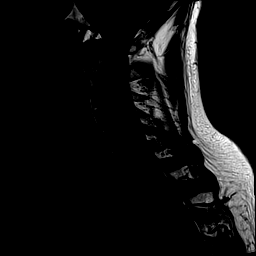
[im 10/15]
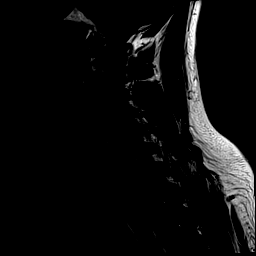
[im 12/15]
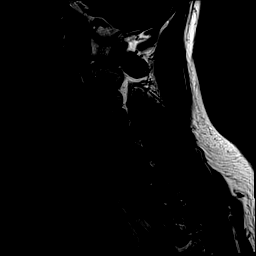
[im 15/15]
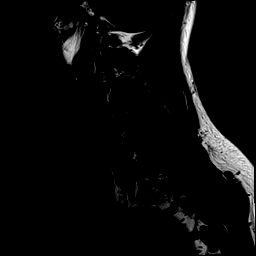

[Series 13: STIR · sagittal · 3.0mm · 0.62mm/px · 7 of 15 slices shown]
[im 1/15]
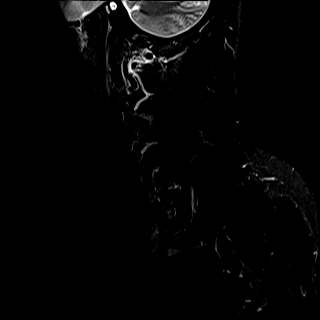
[im 3/15]
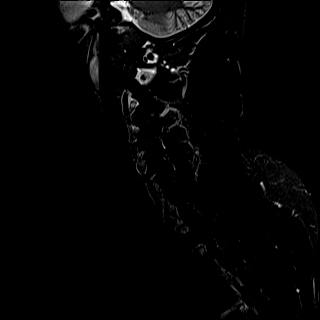
[im 5/15]
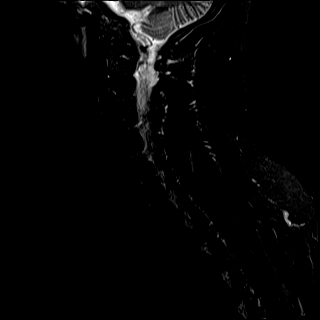
[im 8/15]
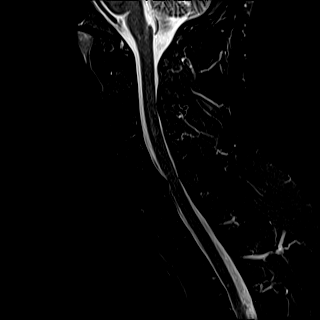
[im 10/15]
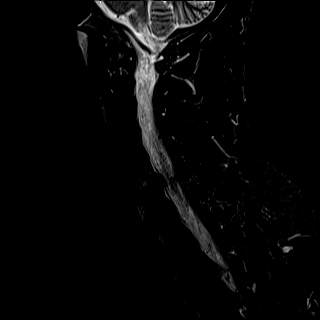
[im 12/15]
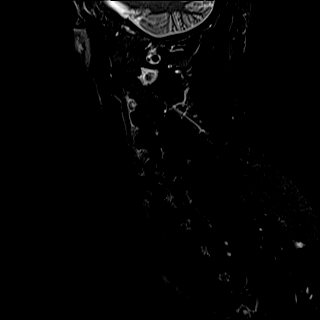
[im 15/15]
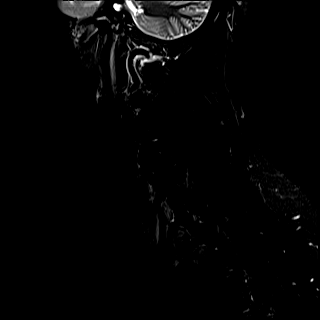

[Series 14: T2 · axial · 3.0mm · 0.70mm/px · z∈[-102,+3]mm · 11 of 28 slices shown (2 of 2)]
[im 1/28]
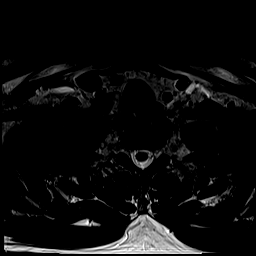
[im 3/28]
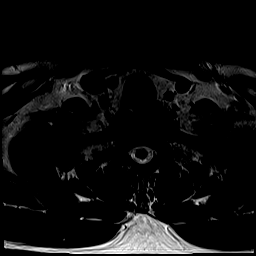
[im 5/28]
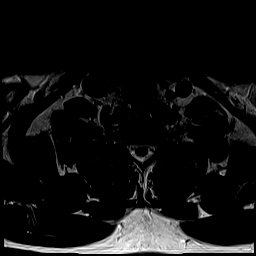
[im 7/28]
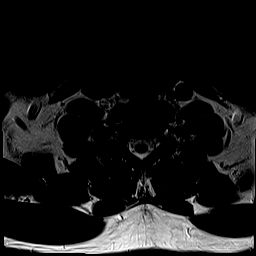
[im 10/28]
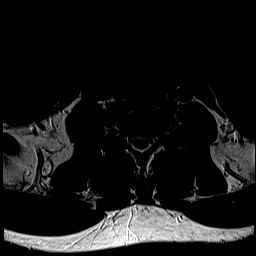
[im 12/28]
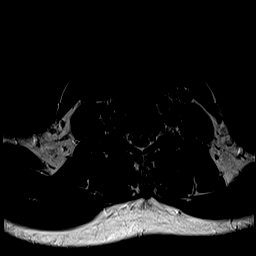
[im 14/28]
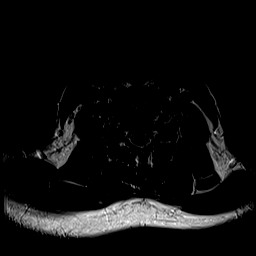
[im 16/28]
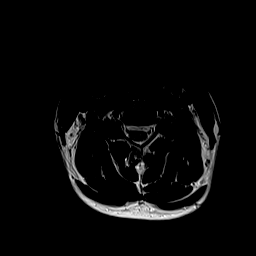
[im 19/28]
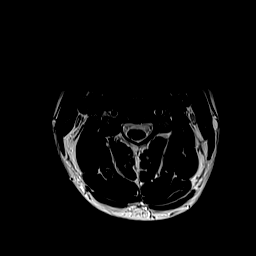
[im 23/28]
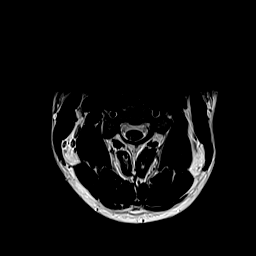
[im 28/28]
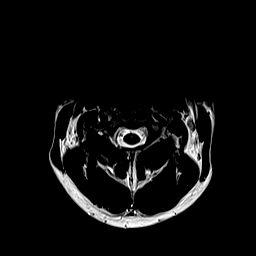

[Series 15: ax mpgr · axial · 3.0mm · 0.35mm/px · z∈[-99,-0]mm · 8 of 30 slices shown]
[im 1/30]
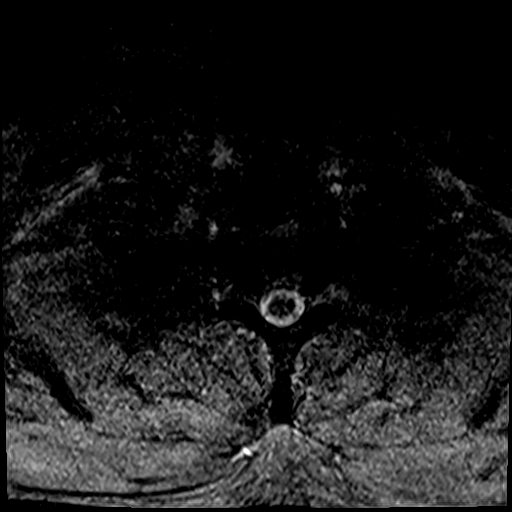
[im 5/30]
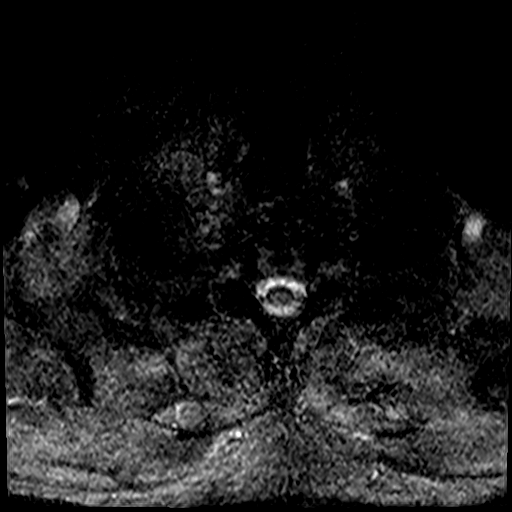
[im 9/30]
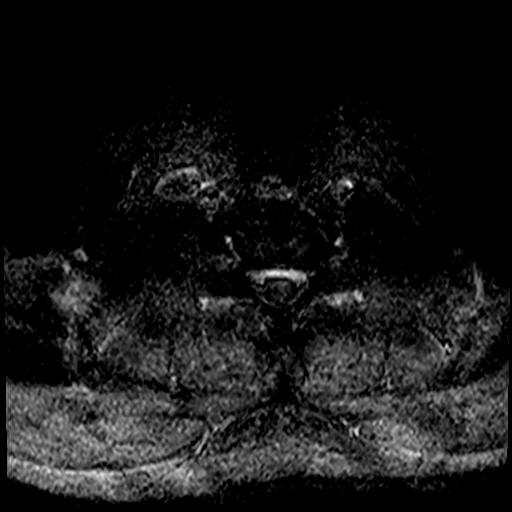
[im 14/30]
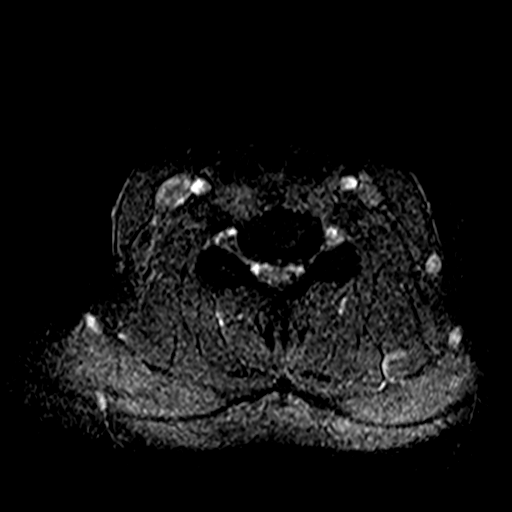
[im 16/30]
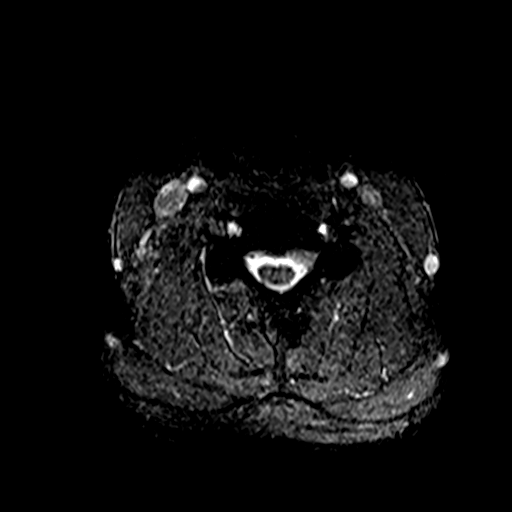
[im 21/30]
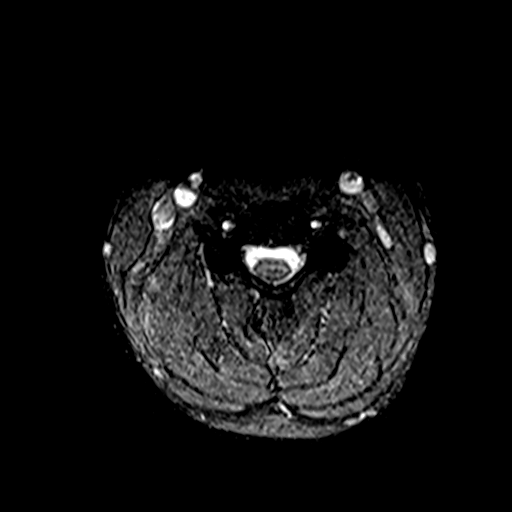
[im 25/30]
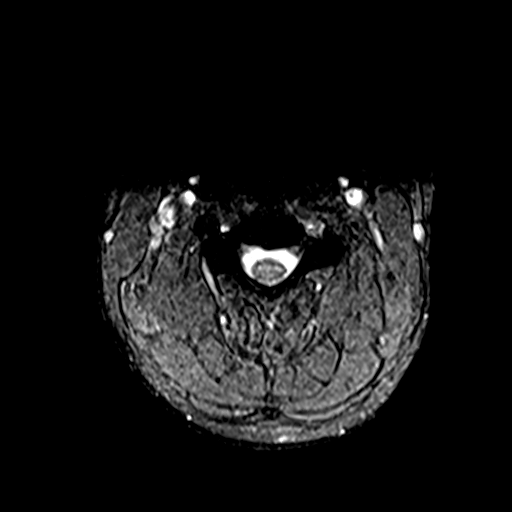
[im 30/30]
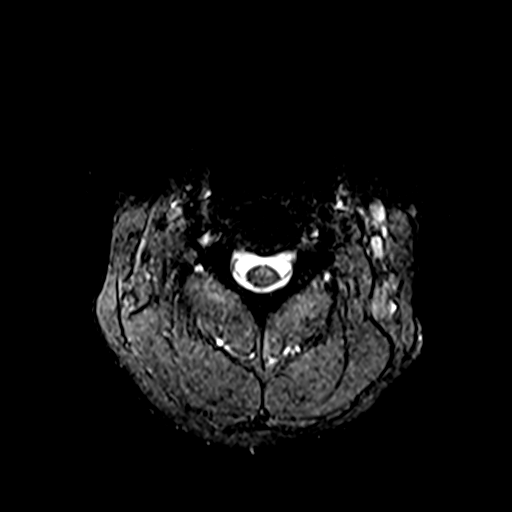

[40 of 48 positions shown; findings below may reference images not displayed]

FINDINGS: Alignment: Normal

Vertebrae: Normal

Cord: Normal.  See below regarding stenosis.

Posterior Fossa, vertebral arteries, paraspinal tissues: Normal

Disc levels:

No abnormality from the foramen magnum through C4-5. Canal and
foramina widely patent.

C5-6: Endplate osteophytes and protruding disc material more
prominent towards the left. Narrowing of the ventral subarachnoid
space but no compression cord. AP diameter of the canal in the
midline 7.6 mm. Bilateral foraminal narrowing left more than right.
In particular, left C6 nerve compression is likely.

C6-7: Mild bulging of the disc.  No compressive stenosis.

C7-T1: Normal
IMPRESSION: C5-6: Endplate osteophytes and protruding disc material more
prominent towards the left. Canal narrowing but no cord compression.
Bilateral foraminal stenosis left more than right, likely to cause
neural compression, particularly of the left C6 nerve.
